# Patient Record
Sex: Female | Born: 1990 | Hispanic: Yes | Marital: Single | Smoking: Never smoker
Health system: Southern US, Community
[De-identification: ages and names within clinical notes are randomized; demographics above are authoritative.]

---

## 2019-03-05 ENCOUNTER — Emergency Department (HOSPITAL_COMMUNITY): Payer: 59

## 2019-03-05 ENCOUNTER — Emergency Department (HOSPITAL_COMMUNITY)
Admission: EM | Admit: 2019-03-05 | Discharge: 2019-03-05 | Disposition: A | Discharge status: Home / Self Care | Payer: 59 | Attending: Emergency Medicine | Admitting: Emergency Medicine

## 2019-03-05 ENCOUNTER — Encounter (HOSPITAL_COMMUNITY): Payer: Self-pay | Admitting: Emergency Medicine

## 2019-03-05 ENCOUNTER — Other Ambulatory Visit: Payer: Self-pay

## 2019-03-05 DIAGNOSIS — K8050 Calculus of bile duct without cholangitis or cholecystitis without obstruction: Secondary | ICD-10-CM | POA: Diagnosis not present

## 2019-03-05 DIAGNOSIS — R1013 Epigastric pain: Secondary | ICD-10-CM | POA: Diagnosis present

## 2019-03-05 LAB — CBC
HCT: 43.4 % (ref 36.0–46.0)
Hemoglobin: 13.9 g/dL (ref 12.0–15.0)
MCH: 28.1 pg (ref 26.0–34.0)
MCHC: 32 g/dL (ref 30.0–36.0)
MCV: 87.7 fL (ref 80.0–100.0)
Platelets: 302 10*3/uL (ref 150–400)
RBC: 4.95 MIL/uL (ref 3.87–5.11)
RDW: 13.8 % (ref 11.5–15.5)
WBC: 14.2 10*3/uL — ABNORMAL HIGH (ref 4.0–10.5)
nRBC: 0 % (ref 0.0–0.2)

## 2019-03-05 LAB — I-STAT BETA HCG BLOOD, ED (MC, WL, AP ONLY): I-stat hCG, quantitative: <5 m[IU]/mL (ref <5)

## 2019-03-05 LAB — URINALYSIS, ROUTINE W REFLEX MICROSCOPIC
Bilirubin Urine: NEGATIVE
Glucose, UA: NEGATIVE mg/dL
Hgb urine dipstick: NEGATIVE
Ketones, ur: NEGATIVE mg/dL
Leukocytes,Ua: NEGATIVE
Nitrite: NEGATIVE
Protein, ur: 100 mg/dL — AB
Specific Gravity, Urine: 1.026 (ref 1.005–1.030)
pH: 8 (ref 5.0–8.0)

## 2019-03-05 LAB — COMPREHENSIVE METABOLIC PANEL
ALT: 17 U/L (ref 0–44)
AST: 18 U/L (ref 15–41)
Albumin: 4.5 g/dL (ref 3.5–5.0)
Alkaline Phosphatase: 72 U/L (ref 38–126)
Anion gap: 11 (ref 5–15)
BUN: 10 mg/dL (ref 6–20)
CO2: 25 mmol/L (ref 22–32)
Calcium: 9.1 mg/dL (ref 8.9–10.3)
Chloride: 103 mmol/L (ref 98–111)
Creatinine, Ser: 0.82 mg/dL (ref 0.44–1.00)
GFR calc Af Amer: >60 mL/min (ref >60)
GFR calc non Af Amer: >60 mL/min (ref >60)
Glucose, Bld: 138 mg/dL — ABNORMAL HIGH (ref 70–99)
Potassium: 4.2 mmol/L (ref 3.5–5.1)
Sodium: 139 mmol/L (ref 135–145)
Total Bilirubin: 0.8 mg/dL (ref 0.3–1.2)
Total Protein: 8.7 g/dL — ABNORMAL HIGH (ref 6.5–8.1)

## 2019-03-05 LAB — LIPASE, BLOOD: Lipase: 29 U/L (ref 11–51)

## 2019-03-05 MED ORDER — ONDANSETRON 4 MG PO TBDP
4.0000 mg | ORAL_TABLET | Freq: Once | ORAL | Status: AC
  Administered 2019-03-05: 03:00:00 4 mg via ORAL
  Filled 2019-03-05: qty 1

## 2019-03-05 MED ORDER — PANTOPRAZOLE SODIUM 40 MG PO TBEC
40.0000 mg | DELAYED_RELEASE_TABLET | Freq: Every day | ORAL | Status: DC
  Administered 2019-03-05: 40 mg via ORAL
  Filled 2019-03-05: qty 1

## 2019-03-05 MED ORDER — ALUM & MAG HYDROXIDE-SIMETH 200-200-20 MG/5ML PO SUSP
30.0000 mL | Freq: Once | ORAL | Status: AC
  Administered 2019-03-05: 30 mL via ORAL
  Filled 2019-03-05: qty 30

## 2019-03-05 MED ORDER — SODIUM CHLORIDE 0.9% FLUSH: 3.0000 mL | Freq: Once | INTRAVENOUS | Status: DC

## 2019-03-05 MED ORDER — ONDANSETRON 4 MG PO TBDP: 4.0000 mg | ORAL_TABLET | Freq: Three times a day (TID) | ORAL | 0 refills | Status: DC | PRN

## 2019-03-05 MED ORDER — HYDROCODONE-ACETAMINOPHEN 5-325 MG PO TABS: 1.0000 | ORAL_TABLET | Freq: Four times a day (QID) | ORAL | 0 refills | Status: DC | PRN

## 2019-03-05 MED ORDER — ONDANSETRON HCL 4 MG/2ML IJ SOLN
4.0000 mg | Freq: Once | INTRAMUSCULAR | Status: AC
  Administered 2019-03-05: 4 mg via INTRAVENOUS
  Filled 2019-03-05: qty 2

## 2019-03-05 MED ORDER — MORPHINE SULFATE (PF) 4 MG/ML IV SOLN
4.0000 mg | Freq: Once | INTRAVENOUS | Status: AC
  Administered 2019-03-05: 4 mg via INTRAVENOUS
  Filled 2019-03-05: qty 1

## 2019-03-05 NOTE — ED Notes (Signed)
Provided patient with oral hydration. Instructed patient to notify RN if N/V occurs. Will continue to monitor patient.

## 2019-03-05 NOTE — ED Provider Notes (Signed)
[facility]-EMERGENCY DEPT Provider Note   CSN: [id number] Arrival date & time: 03/05/19  0240    History   Chief Complaint Chief Complaint  Patient presents with  . Abdominal Pain  . Back Pain    HPI Sandra Stewart is a 28 y.o. female.     HPI  This is a 28 year old female who presents with abdominal pain.  Patient reports onset of epigastric pain 4 hours prior to arrival.  This was after eating.  Patient reports upper epigastric pain radiates into her back.  It is sharp and crampy.  She reports 2 episodes of nonbilious, nonbloody.  Any chest pain, shortness of breath, fevers.  Patient denies any flank pain or urinary symptoms.  She has not taken anything for her symptoms.  Currently she rates her pain at 8 out of 10.  History reviewed. No pertinent past medical history.  There are no active problems to display for this patient.   History reviewed. No pertinent surgical history.   OB History   No obstetric history on file.      Home Medications    Prior to Admission medications   Medication Sig Start Date End Date Taking? Authorizing Provider  HYDROcodone-acetaminophen (NORCO/VICODIN) 5-325 MG tablet Take 1 tablet by mouth every 6 (six) hours as needed. 03/05/19   Toshika Parrow, Mayer Maskerourtney F, MD  ondansetron (ZOFRAN ODT) 4 MG disintegrating tablet Take 1 tablet (4 mg total) by mouth every 8 (eight) hours as needed for nausea or vomiting. 03/05/19   Gabryel Talamo, Mayer Maskerourtney F, MD    Family History No family history on file.  Social History Social History   Tobacco Use  . Smoking status: Never Smoker  . Smokeless tobacco: Never Used  Substance Use Topics  . Alcohol use: Never    Frequency: Never  . Drug use: Never     Allergies   Patient has no allergy information on record.   Review of Systems Review of Systems  Constitutional: Negative for fever.  Respiratory: Negative for shortness of breath.   Cardiovascular: Negative for chest pain.   Gastrointestinal: Positive for abdominal pain, nausea and vomiting. Negative for diarrhea.  Genitourinary: Negative for dysuria and flank pain.  Musculoskeletal: Positive for back pain.  All other systems reviewed and are negative.    Physical Exam Updated Vital Signs BP 126/73 (BP Location: Left Arm)   Pulse 70   Temp 97.8 F (36.6 C) (Oral)   Resp 18   Ht 1.549 m (5\' 1" )   Wt 86.6 kg   SpO2 97%   BMI 36.09 kg/m   Physical Exam Vitals signs and nursing note reviewed.  Constitutional:      Appearance: She is well-developed. She is obese.  HENT:     Head: Normocephalic and atraumatic.  Neck:     Musculoskeletal: Neck supple.  Cardiovascular:     Rate and Rhythm: Normal rate and regular rhythm.  Pulmonary:     Effort: Pulmonary effort is normal. No respiratory distress.  Abdominal:     General: Bowel sounds are normal.     Palpations: Abdomen is soft.     Tenderness: There is abdominal tenderness in the right upper quadrant and epigastric area.  Skin:    General: Skin is warm and dry.  Neurological:     Mental Status: She is alert and oriented to person, place, and time.      ED Treatments / Results  Labs (all labs ordered are listed, but only abnormal results are displayed)  Labs Reviewed  COMPREHENSIVE METABOLIC PANEL - Abnormal; Notable for the following components:      Result Value   Glucose, Bld 138 (*)    Total Protein 8.7 (*)    All other components within normal limits  CBC - Abnormal; Notable for the following components:   WBC 14.2 (*)    All other components within normal limits  URINALYSIS, ROUTINE W REFLEX MICROSCOPIC - Abnormal; Notable for the following components:   APPearance CLOUDY (*)    Protein, ur 100 (*)    Bacteria, UA RARE (*)    All other components within normal limits  LIPASE, BLOOD  I-STAT BETA HCG BLOOD, ED ([facility], [facility], [facility] ONLY)    EKG None  Radiology US Abdomen Limited Ruq  Result Date: 03/05/2019 CLINICAL DATA:   Epigastric abdominal pain since last night. EXAM: ULTRASOUND ABDOMEN LIMITED RIGHT UPPER QUADRANT COMPARISON:  None. FINDINGS: Gallbladder: Shadowing 1.2 cm calcified gallstone within the nondistended gallbladder. No gallbladder wall thickening, sonographic Murphy sign or pericholecystic fluid. Common bile duct: Diameter: 2 mm Liver: No focal lesion identified. Within normal limits in parenchymal echogenicity. Portal vein is patent on color Doppler imaging with normal direction of blood flow towards the liver. IMPRESSION: 1. Cholelithiasis.  No sonographic evidence of acute cholecystitis. 2. No biliary ductal dilatation. 3. Normal liver. Electronically Signed   By: Delbert Phenix M.D.   On: 03/05/2019 06:53    Procedures Procedures (including critical care time)  Medications Ordered in ED Medications  sodium chloride flush (NS) 0.9 % injection 3 mL (3 mLs Intravenous Not Given 03/05/19 0332)  pantoprazole (PROTONIX) EC tablet 40 mg (40 mg Oral Given 03/05/19 0450)  alum & mag hydroxide-simeth (MAALOX/MYLANTA) 200-200-20 MG/5ML suspension 30 mL (30 mLs Oral Given 03/05/19 0328)  ondansetron (ZOFRAN-ODT) disintegrating tablet 4 mg (4 mg Oral Given 03/05/19 0328)  morphine 4 MG/ML injection 4 mg (4 mg Intravenous Given 03/05/19 0535)  ondansetron (ZOFRAN) injection 4 mg (4 mg Intravenous Given 03/05/19 0535)     Initial Impression / Assessment and Plan / ED Course  I have reviewed the triage vital signs and the nursing notes.  Pertinent labs & imaging results that were available during my care of the patient were reviewed by me and considered in my medical decision making (see chart for details).  Clinical Course as of Mar 04 656  Sun Mar 05, 2019  0518 With worsening pain after drinking.  Will obtain ultrasound to evaluate gallbladder.  Patient was given morphine and Zofran.   [CH]    Clinical Course User Index [CH] Pallavi Clifton, Mayer Masker, MD       Patient presents with epigastric pain.  Onset of  pain while eating.  She is overall nontoxic-appearing.  She does have some epigastric tenderness on exam.  Considerations include reflux, pancreatitis, cholecystitis.  She is overall well-appearing.  Vital signs are reassuring.  Lab work obtained and patient was initially given a GI cocktail and Zofran.  She had minimal improvement.  She subsequently drank water and had worsening of pain.  For this reason, right upper quadrant ultrasound was obtained.  I have reviewed her lab work.  Slight leukocytosis but otherwise normal LFTs.  Normal lipase.  Ultrasound shows gallstones.  No evidence of cholecystitis.  On recheck, she is better after pain medication.  Discussed with her symptom control at home and follow-up with general surgery.  She was given strict return precautions.  After history, exam, and medical workup I feel the patient has been  appropriately medically screened and is safe for discharge home. Pertinent diagnoses were discussed with the patient. Patient was given return precautions.   Final Clinical Impressions(s) / ED Diagnoses   Final diagnoses:  Epigastric pain  Biliary colic    ED Discharge Orders         Ordered    HYDROcodone-acetaminophen (NORCO/VICODIN) 5-325 MG tablet  Every 6 hours PRN     03/05/19 0652    ondansetron (ZOFRAN ODT) 4 MG disintegrating tablet  Every 8 hours PRN     03/05/19 0370           Shon Baton, MD 03/05/19 [phone]

## 2019-03-05 NOTE — ED Notes (Signed)
Ultrasound at bedside

## 2019-03-05 NOTE — ED Triage Notes (Signed)
Patient here from home with complaints of upper back pain near neck and mid epigastric abd pain that started this morning. Reports n/v. Ambulatory. Denies pregnancy.

## 2019-03-05 NOTE — Discharge Instructions (Signed)
You were seen today for abdominal pain.  You have gallstones that are likely causing your pain.  Take medications as prescribed.  Follow-up with general surgery as you may need to have your gallbladder taken out.  If you develop worsening pain, fevers, nausea or vomiting, you should be reevaluated.

## 2019-11-19 ENCOUNTER — Other Ambulatory Visit: Payer: Self-pay

## 2019-11-19 ENCOUNTER — Emergency Department (HOSPITAL_COMMUNITY)
Admission: EM | Admit: 2019-11-19 | Discharge: 2019-11-19 | Disposition: A | Discharge status: Home / Self Care | Payer: 59 | Attending: Emergency Medicine | Admitting: Emergency Medicine

## 2019-11-19 ENCOUNTER — Emergency Department (HOSPITAL_COMMUNITY): Payer: 59

## 2019-11-19 DIAGNOSIS — R1013 Epigastric pain: Secondary | ICD-10-CM

## 2019-11-19 DIAGNOSIS — K8050 Calculus of bile duct without cholangitis or cholecystitis without obstruction: Secondary | ICD-10-CM | POA: Diagnosis not present

## 2019-11-19 LAB — URINALYSIS, ROUTINE W REFLEX MICROSCOPIC
Bacteria, UA: NONE SEEN
Bilirubin Urine: NEGATIVE
Glucose, UA: NEGATIVE mg/dL
Hgb urine dipstick: NEGATIVE
Ketones, ur: NEGATIVE mg/dL
Leukocytes,Ua: NEGATIVE
Nitrite: NEGATIVE
Protein, ur: 30 mg/dL — AB
Specific Gravity, Urine: 1.023 (ref 1.005–1.030)
pH: 8 (ref 5.0–8.0)

## 2019-11-19 LAB — CBC
HCT: 41.1 % (ref 36.0–46.0)
Hemoglobin: 13.5 g/dL (ref 12.0–15.0)
MCH: 28.1 pg (ref 26.0–34.0)
MCHC: 32.8 g/dL (ref 30.0–36.0)
MCV: 85.4 fL (ref 80.0–100.0)
Platelets: 272 10*3/uL (ref 150–400)
RBC: 4.81 MIL/uL (ref 3.87–5.11)
RDW: 14.2 % (ref 11.5–15.5)
WBC: 12.7 10*3/uL — ABNORMAL HIGH (ref 4.0–10.5)
nRBC: 0 % (ref 0.0–0.2)

## 2019-11-19 LAB — LIPASE, BLOOD: Lipase: 26 U/L (ref 11–51)

## 2019-11-19 LAB — COMPREHENSIVE METABOLIC PANEL
ALT: 23 U/L (ref 0–44)
AST: 19 U/L (ref 15–41)
Albumin: 4 g/dL (ref 3.5–5.0)
Alkaline Phosphatase: 56 U/L (ref 38–126)
Anion gap: 9 (ref 5–15)
BUN: 9 mg/dL (ref 6–20)
CO2: 24 mmol/L (ref 22–32)
Calcium: 9.1 mg/dL (ref 8.9–10.3)
Chloride: 106 mmol/L (ref 98–111)
Creatinine, Ser: 0.77 mg/dL (ref 0.44–1.00)
GFR calc Af Amer: >60 mL/min (ref >60)
GFR calc non Af Amer: >60 mL/min (ref >60)
Glucose, Bld: 135 mg/dL — ABNORMAL HIGH (ref 70–99)
Potassium: 4 mmol/L (ref 3.5–5.1)
Sodium: 139 mmol/L (ref 135–145)
Total Bilirubin: 0.8 mg/dL (ref 0.3–1.2)
Total Protein: 7 g/dL (ref 6.5–8.1)

## 2019-11-19 LAB — I-STAT BETA HCG BLOOD, ED (MC, WL, AP ONLY): I-stat hCG, quantitative: <5 m[IU]/mL (ref <5)

## 2019-11-19 MED ORDER — FENTANYL CITRATE (PF) 100 MCG/2ML IJ SOLN
50.0000 ug | Freq: Once | INTRAMUSCULAR | Status: DC
  Filled 2019-11-19: qty 2

## 2019-11-19 MED ORDER — HYDROCODONE-ACETAMINOPHEN 5-325 MG PO TABS: 1.0000 | ORAL_TABLET | ORAL | 0 refills | Status: DC | PRN

## 2019-11-19 MED ORDER — SODIUM CHLORIDE 0.9% FLUSH: 3.0000 mL | Freq: Once | INTRAVENOUS | Status: DC

## 2019-11-19 MED ORDER — MORPHINE SULFATE (PF) 4 MG/ML IV SOLN
4.0000 mg | Freq: Once | INTRAVENOUS | Status: AC
  Administered 2019-11-19: 07:00:00 4 mg via INTRAVENOUS
  Filled 2019-11-19: qty 1

## 2019-11-19 MED ORDER — ONDANSETRON 4 MG PO TBDP: 4.0000 mg | ORAL_TABLET | Freq: Three times a day (TID) | ORAL | 0 refills | Status: DC | PRN

## 2019-11-19 NOTE — ED Triage Notes (Signed)
Per pt has hx of gallstones and tonight pt started having severe upper gastric pain. Pt said no nausea but did throw up tonight. Pt has no fevers,

## 2019-11-19 NOTE — Discharge Instructions (Signed)
Return to the ED with any concerns including vomiting and not able to keep down liquids or your medications, abdominal pain especially if it localizes to the right lower abdomen, fever or chills, and decreased urine output, decreased level of alertness or lethargy, or any other alarming symptoms.  °

## 2019-11-19 NOTE — ED Notes (Signed)
Pt given water for fluid challenge 

## 2019-11-19 NOTE — ED Notes (Signed)
Patient transported to Ultrasound 

## 2019-11-19 NOTE — ED Provider Notes (Signed)
[facility] EMERGENCY DEPARTMENT Provider Note   CSN: [id number] Arrival date & time: 11/19/19  5400     History Chief Complaint  Patient presents with  . Abdominal Pain    Sandra Stewart is a 28 y.o. female with no major medical problems presents to the Emergency Department complaining of gradual, persistent, progressively worsening epigastric and right upper quadrant abdominal pain onset around 11 PM.  Patient reports pain is stabbing and sharp in nature.  She rates it as a 9/10.  Patient reports it does radiate into her back.  Patient reports she has previously had pain like this in the past.  She was evaluated in April 2020 for this and diagnosed with gallstones.  She has not followed up with general surgery.  Patient reports severe nausea but no vomiting.  No fever, chills, headache, neck pain, chest pain, weakness, dizziness, syncope.  Patient denies urinary and vaginal symptoms.  No treatments prior to arrival.  The history is provided by the patient and medical records. No language interpreter was used.       No past medical history on file.  There are no problems to display for this patient.   No past surgical history on file.   OB History   No obstetric history on file.     No family history on file.  Social History   Tobacco Use  . Smoking status: Never Smoker  . Smokeless tobacco: Never Used  Substance Use Topics  . Alcohol use: Never  . Drug use: Never    Home Medications Prior to Admission medications   Medication Sig Start Date End Date Taking? Authorizing Provider  HYDROcodone-acetaminophen (NORCO/VICODIN) 5-325 MG tablet Take 1 tablet by mouth every 6 (six) hours as needed. 03/05/19   Horton, Mayer Masker, MD  ondansetron (ZOFRAN ODT) 4 MG disintegrating tablet Take 1 tablet (4 mg total) by mouth every 8 (eight) hours as needed for nausea or vomiting. 03/05/19   Horton, Mayer Masker, MD    Allergies    Patient has no allergy  information on record.  Review of Systems   Review of Systems  Constitutional: Negative for appetite change, diaphoresis, fatigue, fever and unexpected weight change.  HENT: Negative for mouth sores and trouble swallowing.   Respiratory: Negative for cough, chest tightness, shortness of breath, wheezing and stridor.   Cardiovascular: Negative for chest pain and palpitations.  Gastrointestinal: Positive for abdominal pain and nausea. Negative for abdominal distention, blood in stool, constipation, diarrhea, rectal pain and vomiting.  Genitourinary: Negative for difficulty urinating, dysuria, flank pain, frequency, hematuria and urgency.  Musculoskeletal: Negative for back pain, neck pain and neck stiffness.  Skin: Negative for rash.  Neurological: Negative for weakness.  Hematological: Negative for adenopathy.  Psychiatric/Behavioral: Negative for confusion.  All other systems reviewed and are negative.   Physical Exam Updated Vital Signs BP 122/82 (BP Location: Right Arm)   Pulse 62   Temp 98.3 F (36.8 C) (Oral)   Resp 16   SpO2 100%   Physical Exam Vitals and nursing note reviewed.  Constitutional:      General: She is not in acute distress.    Appearance: She is not diaphoretic.  HENT:     Head: Normocephalic.  Eyes:     General: No scleral icterus.    Conjunctiva/sclera: Conjunctivae normal.  Cardiovascular:     Rate and Rhythm: Normal rate and regular rhythm.     Pulses: Normal pulses.  Radial pulses are 2+ on the right side and 2+ on the left side.  Pulmonary:     Effort: No tachypnea, accessory muscle usage, prolonged expiration, respiratory distress or retractions.     Breath sounds: No stridor.     Comments: Equal chest rise. No increased work of breathing. Abdominal:     General: There is no distension.     Palpations: Abdomen is soft.     Tenderness: There is abdominal tenderness in the right upper quadrant and epigastric area. There is no guarding  or rebound.  Musculoskeletal:     Cervical back: Normal range of motion.     Comments: Moves all extremities equally and without difficulty.  Skin:    General: Skin is warm and dry.     Capillary Refill: Capillary refill takes less than 2 seconds.  Neurological:     Mental Status: She is alert.     GCS: GCS eye subscore is 4. GCS verbal subscore is 5. GCS motor subscore is 6.     Comments: Speech is clear and goal oriented.  Psychiatric:        Mood and Affect: Mood normal.     ED Results / Procedures / Treatments   Labs (all labs ordered are listed, but only abnormal results are displayed) Labs Reviewed  COMPREHENSIVE METABOLIC PANEL - Abnormal; Notable for the following components:      Result Value   Glucose, Bld 135 (*)    All other components within normal limits  CBC - Abnormal; Notable for the following components:   WBC 12.7 (*)    All other components within normal limits  URINALYSIS, ROUTINE W REFLEX MICROSCOPIC - Abnormal; Notable for the following components:   APPearance CLOUDY (*)    Protein, ur 30 (*)    All other components within normal limits  LIPASE, BLOOD  I-STAT BETA HCG BLOOD, ED ([facility], [facility], [facility] ONLY)    Radiology US Abdomen Limited  Result Date: 11/19/2019 CLINICAL DATA:  History of gallstones. Right upper quadrant pain. Vomiting. EXAM: ULTRASOUND ABDOMEN LIMITED RIGHT UPPER QUADRANT COMPARISON:  03/05/2019 FINDINGS: Gallbladder: Evidence of gallbladder sludge and mild cholelithiasis with largest stone measuring 1.4 cm. Borderline wall thickening measuring 3.2 mm. No adjacent free fluid. Negative sonographic Murphy sign. Common bile duct: Diameter: 3.2 mm. Liver: Mild increased parenchymal echogenicity likely due to steatosis without focal mass. Portal vein is patent on color Doppler imaging with normal direction of blood flow towards the liver. Other: None. IMPRESSION: Mild cholelithiasis and gallbladder sludge without additional sonographic evidence to  suggest cholecystitis. Mild hepatic steatosis without focal mass. Electronically Signed   By: Elberta Fortisaniel  Boyle M.D.   On: 11/19/2019 05:26    Procedures Procedures (including critical care time)  Medications Ordered in ED Medications  sodium chloride flush (NS) 0.9 % injection 3 mL (has no administration in time range)  fentaNYL (SUBLIMAZE) injection 50 mcg (50 mcg Intravenous Not Given 11/19/19 0707)  morphine 4 MG/ML injection 4 mg (4 mg Intravenous Given 11/19/19 [id number])    ED Course  I have reviewed the triage vital signs and the nursing notes.  Pertinent labs & imaging results that were available during my care of the patient were reviewed by me and considered in my medical decision making (see chart for details).    MDM Rules/Calculators/A&P                       Patient presents with epigastric and right upper quadrant abdominal  pain.  She does have a history of gallstones.  Considerations tonight include reflux, pancreatitis, cholecystitis.  Labs are reassuring along with vital signs.  She is afebrile without tachycardia or hypotension.  No evidence of sepsis.  I personally reviewed the chart.  Ultrasound from April 2020 does show gallstones.  She did not have cholecystitis at that time.  Nausea medication and pain medication given here.  Will repeat ultrasound to ensure no common bile duct stone or cholecystitis.  7:15 AM [place] with gallstones but without cholecystitis or CBD stone. Pt with continued pain.  Will give additional meds and PO trial.  At shift change care was transferred to Dr. Marcha Dutton who will re-evaulate after PO trial and determine disposition. Pt with need outpatient surgery consult.     Final Clinical Impression(s) / ED Diagnoses Final diagnoses:  Epigastric pain  Biliary colic    Rx / DC Orders ED Discharge Orders    None       Janina Trafton, Gwenlyn Perking 11/19/19 2549    Varney Biles, MD 11/19/19 2341

## 2019-11-19 NOTE — ED Notes (Signed)
This RN drew up pt's fentanyl before pt was transported to [place]. Fentanyl was not given prior to transport to [place]. When pt returned from [place], provider saw pt at bedside and ordered morphine dose, full dose fentanyl wasted in biohazard container. Total of 2 mLs wasted. Witnessed by Maretta Bees, Therapist, sports.

## 2019-11-19 NOTE — ED Notes (Signed)
Pt placed on continuous pulse ox

## 2020-02-05 ENCOUNTER — Other Ambulatory Visit: Payer: Self-pay

## 2020-02-05 ENCOUNTER — Encounter (HOSPITAL_COMMUNITY): Payer: Self-pay

## 2020-02-05 ENCOUNTER — Observation Stay (HOSPITAL_COMMUNITY)
Admission: EM | Admit: 2020-02-05 | Discharge: 2020-02-07 | Disposition: A | Discharge status: Home / Self Care | Payer: 59 | Attending: Surgery | Admitting: Surgery

## 2020-02-05 ENCOUNTER — Emergency Department (HOSPITAL_COMMUNITY): Payer: 59

## 2020-02-05 DIAGNOSIS — Z6837 Body mass index (BMI) 37.0-37.9, adult: Secondary | ICD-10-CM | POA: Insufficient documentation

## 2020-02-05 DIAGNOSIS — K819 Cholecystitis, unspecified: Secondary | ICD-10-CM

## 2020-02-05 DIAGNOSIS — R739 Hyperglycemia, unspecified: Secondary | ICD-10-CM | POA: Diagnosis not present

## 2020-02-05 DIAGNOSIS — K8010 Calculus of gallbladder with chronic cholecystitis without obstruction: Principal | ICD-10-CM | POA: Insufficient documentation

## 2020-02-05 DIAGNOSIS — R109 Unspecified abdominal pain: Secondary | ICD-10-CM | POA: Diagnosis present

## 2020-02-05 DIAGNOSIS — K810 Acute cholecystitis: Secondary | ICD-10-CM | POA: Diagnosis present

## 2020-02-05 DIAGNOSIS — Z20822 Contact with and (suspected) exposure to covid-19: Secondary | ICD-10-CM | POA: Insufficient documentation

## 2020-02-05 DIAGNOSIS — R1011 Right upper quadrant pain: Secondary | ICD-10-CM

## 2020-02-05 LAB — CBC WITH DIFFERENTIAL/PLATELET
Abs Immature Granulocytes: 0.05 10*3/uL (ref 0.00–0.07)
Basophils Absolute: 0.1 10*3/uL (ref 0.0–0.1)
Basophils Relative: 1 %
Eosinophils Absolute: 0 10*3/uL (ref 0.0–0.5)
Eosinophils Relative: 0 %
HCT: 42.3 % (ref 36.0–46.0)
Hemoglobin: 13.5 g/dL (ref 12.0–15.0)
Immature Granulocytes: 0 %
Lymphocytes Relative: 15 %
Lymphs Abs: 2 10*3/uL (ref 0.7–4.0)
MCH: 27.8 pg (ref 26.0–34.0)
MCHC: 31.9 g/dL (ref 30.0–36.0)
MCV: 87.2 fL (ref 80.0–100.0)
Monocytes Absolute: 0.6 10*3/uL (ref 0.1–1.0)
Monocytes Relative: 5 %
Neutro Abs: 10.3 10*3/uL — ABNORMAL HIGH (ref 1.7–7.7)
Neutrophils Relative %: 79 %
Platelets: 298 10*3/uL (ref 150–400)
RBC: 4.85 MIL/uL (ref 3.87–5.11)
RDW: 13.4 % (ref 11.5–15.5)
WBC: 13 10*3/uL — ABNORMAL HIGH (ref 4.0–10.5)
nRBC: 0 % (ref 0.0–0.2)

## 2020-02-05 LAB — COMPREHENSIVE METABOLIC PANEL
ALT: 20 U/L (ref 0–44)
AST: 29 U/L (ref 15–41)
Albumin: 4.3 g/dL (ref 3.5–5.0)
Alkaline Phosphatase: 56 U/L (ref 38–126)
Anion gap: 10 (ref 5–15)
BUN: 14 mg/dL (ref 6–20)
CO2: 26 mmol/L (ref 22–32)
Calcium: 8.9 mg/dL (ref 8.9–10.3)
Chloride: 105 mmol/L (ref 98–111)
Creatinine, Ser: 0.92 mg/dL (ref 0.44–1.00)
GFR calc Af Amer: >60 mL/min (ref >60)
GFR calc non Af Amer: >60 mL/min (ref >60)
Glucose, Bld: 153 mg/dL — ABNORMAL HIGH (ref 70–99)
Potassium: 4.7 mmol/L (ref 3.5–5.1)
Sodium: 141 mmol/L (ref 135–145)
Total Bilirubin: 1.1 mg/dL (ref 0.3–1.2)
Total Protein: 7.5 g/dL (ref 6.5–8.1)

## 2020-02-05 LAB — RESPIRATORY PANEL BY RT PCR (FLU A&B, COVID)
Influenza A by PCR: NEGATIVE
Influenza B by PCR: NEGATIVE
SARS Coronavirus 2 by RT PCR: NEGATIVE

## 2020-02-05 LAB — URINALYSIS, ROUTINE W REFLEX MICROSCOPIC
Bilirubin Urine: NEGATIVE
Glucose, UA: NEGATIVE mg/dL
Hgb urine dipstick: NEGATIVE
Ketones, ur: NEGATIVE mg/dL
Leukocytes,Ua: NEGATIVE
Nitrite: NEGATIVE
Protein, ur: NEGATIVE mg/dL
Specific Gravity, Urine: 1.021 (ref 1.005–1.030)
pH: 7 (ref 5.0–8.0)

## 2020-02-05 LAB — HIV ANTIBODY (ROUTINE TESTING W REFLEX): HIV Screen 4th Generation wRfx: NONREACTIVE

## 2020-02-05 LAB — I-STAT BETA HCG BLOOD, ED (MC, WL, AP ONLY): I-stat hCG, quantitative: <5 m[IU]/mL (ref <5)

## 2020-02-05 LAB — LIPASE, BLOOD: Lipase: 22 U/L (ref 11–51)

## 2020-02-05 MED ORDER — ENOXAPARIN SODIUM 40 MG/0.4ML ~~LOC~~ SOLN
40.0000 mg | SUBCUTANEOUS | Status: DC
  Administered 2020-02-05: 40 mg via SUBCUTANEOUS
  Filled 2020-02-05: qty 0.4

## 2020-02-05 MED ORDER — SODIUM CHLORIDE 0.9 % IV BOLUS
1000.0000 mL | Freq: Once | INTRAVENOUS | Status: AC
  Administered 2020-02-05: 1000 mL via INTRAVENOUS

## 2020-02-05 MED ORDER — DIPHENHYDRAMINE HCL 25 MG PO CAPS: 25.0000 mg | ORAL_CAPSULE | Freq: Four times a day (QID) | ORAL | Status: DC | PRN

## 2020-02-05 MED ORDER — POLYETHYLENE GLYCOL 3350 17 G PO PACK: 17.0000 g | PACK | Freq: Every day | ORAL | Status: DC | PRN

## 2020-02-05 MED ORDER — IOHEXOL 300 MG/ML  SOLN
100.0000 mL | Freq: Once | INTRAMUSCULAR | Status: AC | PRN
  Administered 2020-02-05: 100 mL via INTRAVENOUS

## 2020-02-05 MED ORDER — MORPHINE SULFATE (PF) 4 MG/ML IV SOLN
4.0000 mg | Freq: Once | INTRAVENOUS | Status: AC
  Administered 2020-02-05: 4 mg via INTRAVENOUS
  Filled 2020-02-05: qty 1

## 2020-02-05 MED ORDER — HYDROMORPHONE HCL 1 MG/ML IJ SOLN: 0.5000 mg | INTRAMUSCULAR | Status: DC | PRN

## 2020-02-05 MED ORDER — DIPHENHYDRAMINE HCL 50 MG/ML IJ SOLN: 25.0000 mg | Freq: Four times a day (QID) | INTRAMUSCULAR | Status: DC | PRN

## 2020-02-05 MED ORDER — SODIUM CHLORIDE (PF) 0.9 % IJ SOLN
INTRAMUSCULAR | Status: AC
  Filled 2020-02-05: qty 50

## 2020-02-05 MED ORDER — ACETAMINOPHEN 325 MG PO TABS: 650.0000 mg | ORAL_TABLET | Freq: Four times a day (QID) | ORAL | Status: DC | PRN

## 2020-02-05 MED ORDER — SODIUM CHLORIDE 0.9 % IV SOLN
2.0000 g | INTRAVENOUS | Status: DC
  Administered 2020-02-05: 2 g via INTRAVENOUS
  Filled 2020-02-05: qty 2
  Filled 2020-02-05: qty 20

## 2020-02-05 MED ORDER — ONDANSETRON HCL 4 MG/2ML IJ SOLN
4.0000 mg | Freq: Once | INTRAMUSCULAR | Status: AC
  Administered 2020-02-05: 4 mg via INTRAVENOUS
  Filled 2020-02-05: qty 2

## 2020-02-05 MED ORDER — SODIUM CHLORIDE 0.9 % IV SOLN: INTRAVENOUS | Status: DC

## 2020-02-05 MED ORDER — ONDANSETRON HCL 4 MG/2ML IJ SOLN: 4.0000 mg | Freq: Four times a day (QID) | INTRAMUSCULAR | Status: DC | PRN

## 2020-02-05 MED ORDER — METOPROLOL TARTRATE 5 MG/5ML IV SOLN: 5.0000 mg | Freq: Four times a day (QID) | INTRAVENOUS | Status: DC | PRN

## 2020-02-05 MED ORDER — ACETAMINOPHEN 650 MG RE SUPP: 650.0000 mg | Freq: Four times a day (QID) | RECTAL | Status: DC | PRN

## 2020-02-05 MED ORDER — ONDANSETRON 4 MG PO TBDP: 4.0000 mg | ORAL_TABLET | Freq: Four times a day (QID) | ORAL | Status: DC | PRN

## 2020-02-05 NOTE — ED Provider Notes (Signed)
[facility]-EMERGENCY DEPT Provider Note   CSN: [id number] Arrival date & time: 02/05/20  7672    History Chief Complaint  Patient presents with  . Abdominal Pain    Sandra Stewart is a 29 y.o. female with past medical history significant for obesity, gallstones who presents for evaluation of abdominal pain.  Patient states approximately 5 AM she developed severe right upper quadrant abdominal pain.  There is no radiation.  States pain similar to her prior "gallstone attacks."  Persistent nausea without emesis.  She took a home Norco x2 which did not help with her pain so she proceeded here to the emergency department for evaluation.  Patient denies fever, chills, chest pain, shortness of breath, dysuria, diarrhea, constipation.  Rates pain a 10/10.  Denies additional aggravating relieving factors.  No chest pain, shortness of breath, hemoptysis. Has appointment in April with Gen Surgery for evaluation for gallstones.  Was evaluated in April and Dec 2020 for similar pain.  History obtained from patient and past medical history. No interpretor was used.  HPI     History reviewed. No pertinent past medical history.  Patient Active Problem List   Diagnosis Date Noted  . Acute cholecystitis 02/05/2020    History reviewed. No pertinent surgical history.   OB History   No obstetric history on file.     History reviewed. No pertinent family history.  Social History   Tobacco Use  . Smoking status: Never Smoker  . Smokeless tobacco: Never Used  Substance Use Topics  . Alcohol use: Never  . Drug use: Never    Home Medications Prior to Admission medications   Medication Sig Start Date End Date Taking? Authorizing Provider  ibuprofen (ADVIL) 200 MG tablet Take 400 mg by mouth daily as needed for mild pain.   Yes [provider]    Allergies    Patient has no known allergies.  Review of Systems   Review of Systems  HENT: Negative.     Respiratory: Negative.   Cardiovascular: Negative.   Gastrointestinal: Positive for abdominal pain and nausea. Negative for abdominal distention, anal bleeding, blood in stool, constipation, diarrhea, rectal pain and vomiting.  Genitourinary: Negative.   Musculoskeletal: Negative.   Skin: Negative.   Neurological: Negative.   All other systems reviewed and are negative.   Physical Exam Updated Vital Signs BP 115/81   Pulse 94   Temp 98 F (36.7 C) (Oral)   Resp 18   Ht 5\' 1"  (1.549 m)   Wt 90.7 kg   LMP 12/08/2019   SpO2 99%   BMI 37.79 kg/m   Physical Exam Vitals and nursing note reviewed.  Constitutional:      General: She is not in acute distress.    Appearance: She is well-developed. She is not ill-appearing, toxic-appearing or diaphoretic.  HENT:     Head: Normocephalic and atraumatic.     Mouth/Throat:     Mouth: Mucous membranes are moist.  Eyes:     Pupils: Pupils are equal, round, and reactive to light.  Cardiovascular:     Rate and Rhythm: Normal rate.     Heart sounds: Normal heart sounds.  Pulmonary:     Effort: Pulmonary effort is normal. No respiratory distress.     Breath sounds: Normal breath sounds.  Abdominal:     General: Bowel sounds are normal. There is no distension.     Palpations: Abdomen is soft.     Tenderness: There is abdominal tenderness in the  right upper quadrant. There is no right CVA tenderness, left CVA tenderness, guarding or rebound. Negative signs include Murphy's sign.     Hernia: No hernia is present.     Comments: Moderate tenderness to RUQ. Negative Murphy sign. No rebound or guarding.  Musculoskeletal:        General: Normal range of motion.     Cervical back: Normal range of motion.     Comments: Moves all 4 extremities without difficulty.  Skin:    General: Skin is warm and dry.     Capillary Refill: Capillary refill takes less than 2 seconds.     Comments: Brisk cap refill.  Neurological:     Mental Status: She is  alert.     ED Results / Procedures / Treatments   Labs (all labs ordered are listed, but only abnormal results are displayed) Labs Reviewed  CBC WITH DIFFERENTIAL/PLATELET - Abnormal; Notable for the following components:      Result Value   WBC 13.0 (*)    Neutro Abs 10.3 (*)    All other components within normal limits  COMPREHENSIVE METABOLIC PANEL - Abnormal; Notable for the following components:   Glucose, Bld 153 (*)    All other components within normal limits  URINALYSIS, ROUTINE W REFLEX MICROSCOPIC - Abnormal; Notable for the following components:   APPearance HAZY (*)    All other components within normal limits  RESPIRATORY PANEL BY RT PCR (FLU A&B, COVID)  LIPASE, BLOOD  HIV ANTIBODY (ROUTINE TESTING W REFLEX)  CBC  CREATININE, SERUM  I-STAT BETA HCG BLOOD, ED ([facility], [facility], [facility] ONLY)    EKG None  Radiology CT ABDOMEN PELVIS W CONTRAST  Result Date: 02/05/2020 CLINICAL DATA:  Right upper quadrant abdominal pain intermittently for 1 year, worsening today. Cholelithiasis on ultrasound earlier today. EXAM: CT ABDOMEN AND PELVIS WITH CONTRAST TECHNIQUE: Multidetector CT imaging of the abdomen and pelvis was performed using the standard protocol following bolus administration of intravenous contrast. CONTRAST:  OMNIPAQUE IOHEXOL 300 MG/ML  SOLN COMPARISON:  02/05/2020 abdominal sonogram. FINDINGS: Lower chest: No significant pulmonary nodules or acute consolidative airspace disease. Hepatobiliary: Normal liver size. No liver mass. Faintly calcified gallstones in the gallbladder. Mild diffuse gallbladder wall thickening. Borderline mild gallbladder distention. No pericholecystic fluid. No biliary ductal dilatation. CBD diameter 3 mm. Pancreas: Normal, with no mass or duct dilation. Spleen: Normal size. No mass. Adrenals/Urinary Tract: Normal adrenals. Normal kidneys with no hydronephrosis and no renal mass. Normal bladder. Stomach/Bowel: Normal non-distended stomach. Normal  caliber small bowel with no small bowel wall thickening. Apparent appendectomy. Normal large bowel with no diverticulosis, large bowel wall thickening or pericolonic fat stranding. Vascular/Lymphatic: Normal caliber abdominal aorta. Patent portal, splenic, hepatic and renal veins. No pathologically enlarged lymph nodes in the abdomen or pelvis. Reproductive: Grossly normal uterus.  No adnexal mass. Other: No pneumoperitoneum, ascites or focal fluid collection. Musculoskeletal: No aggressive appearing focal osseous lesions. IMPRESSION: 1. Cholelithiasis. Mild diffuse gallbladder wall thickening. Borderline mild gallbladder distention. No pericholecystic fluid. Acute calculus cholecystitis not excluded. Hepatobiliary scintigraphy study may be considered for further evaluation as clinically warranted. 2. No biliary ductal dilation. Electronically Signed   By: Delbert Phenix M.D.   On: 02/05/2020 12:36   US Abdomen Limited RUQ  Result Date: 02/05/2020 CLINICAL DATA:  Upper abdominal pain EXAM: ULTRASOUND ABDOMEN LIMITED RIGHT UPPER QUADRANT COMPARISON:  None. FINDINGS: Gallbladder: Within the gallbladder, there is an echogenic focus which moves and shadows consistent with cholelithiasis. This echogenic focus measures  1.0 cm in length. Gallbladder wall thickness is upper normal. No pericholecystic fluid. No sonographic Murphy sign noted by sonographer. Common bile duct: Diameter: 4 mm. No intrahepatic or extrahepatic biliary duct dilatation. Liver: No focal lesion identified. Within normal limits in parenchymal echogenicity. Portal vein is patent on color Doppler imaging with normal direction of blood flow towards the liver. Other: None. IMPRESSION: Cholelithiasis with gallbladder wall thickening upper normal. This finding may warrant correlation with nuclear medicine hepatobiliary gene study to assess for cystic duct patency. Study otherwise unremarkable. Electronically Signed   By: Lowella Grip III M.D.   On:  02/05/2020 10:48    Procedures Procedures (including critical care time)  Medications Ordered in ED Medications  enoxaparin (LOVENOX) injection 40 mg (has no administration in time range)  0.9 %  sodium chloride infusion (has no administration in time range)  cefTRIAXone (ROCEPHIN) 2 g in sodium chloride 0.9 % 100 mL IVPB (has no administration in time range)  acetaminophen (TYLENOL) tablet 650 mg (has no administration in time range)    Or  acetaminophen (TYLENOL) suppository 650 mg (has no administration in time range)  HYDROmorphone (DILAUDID) injection 0.5 mg (has no administration in time range)  diphenhydrAMINE (BENADRYL) capsule 25 mg (has no administration in time range)    Or  diphenhydrAMINE (BENADRYL) injection 25 mg (has no administration in time range)  polyethylene glycol (MIRALAX / GLYCOLAX) packet 17 g (has no administration in time range)  ondansetron (ZOFRAN-ODT) disintegrating tablet 4 mg (has no administration in time range)    Or  ondansetron (ZOFRAN) injection 4 mg (has no administration in time range)  metoprolol tartrate (LOPRESSOR) injection 5 mg (has no administration in time range)  sodium chloride 0.9 % bolus 1,000 mL (0 mLs Intravenous Stopped 02/05/20 1121)  ondansetron (ZOFRAN) injection 4 mg (4 mg Intravenous Given 02/05/20 0949)  morphine 4 MG/ML injection 4 mg (4 mg Intravenous Given 02/05/20 0949)  morphine 4 MG/ML injection 4 mg (4 mg Intravenous Given 02/05/20 1121)  sodium chloride (PF) 0.9 % injection (  Given by Other 02/05/20 1223)  iohexol (OMNIPAQUE) 300 MG/ML solution 100 mL (100 mLs Intravenous Contrast Given 02/05/20 1156)   ED Course  I have reviewed the triage vital signs and the nursing notes.  Pertinent labs & imaging results that were available during my care of the patient were reviewed by me and considered in my medical decision making (see chart for details).  29 year old female appears otherwise well presents for evaluation abdominal  pain.  Known history of gallstones states this feels similar.  Pain began at 5 AM this morning.  Patient moderate tenderness to right upper quadrant however negative Murphy sign.  Took 1 dose home Norco without relief of her pain. Has FU with gen surgery for gallstones in April. Personally reviewed [place] in Dec with cholecystitis at that time. Will repeat US to ensure no cholecystitis, CB duct stone.  Labs and imaging personally reviewed and interpreted: CBC leukocytosis with left shift at 13.0 UA negative CMP mild hyperglycemia to 153 no acute electrolyte, renal or liver normality Lipase 22 Preg negative [place] RUQ with borderline gallbladder wall thickening. CT with similar read borderline. Can order HIDA to r/o acute cholecystitis.  Will consult with surgery given her pain was uncontrolled with p.o. Norco at home.  Patient reassessed. Pain controlled with IV meds in ED. CONSULT with OR nurse for general surgery. Discuss [place] and CT findings. They will come down to assess patient. Will hold on HIDA scan  until general surgery has evaluated patient.  CONSULT with General surgery, [place], PA-C who has evaluated patient.  Plan for lap chole for acute cholecystitis tomorrow morning.  They will place antibiotic orders and admit patient.  The patient appears reasonably stabilized for admission considering the current resources, flow, and capabilities available in the ED at this time, and I doubt any other [facility] requiring further screening and/or treatment in the ED prior to admission.    MDM Rules/Calculators/A&P                       Final Clinical Impression(s) / ED Diagnoses Final diagnoses:  RUQ pain  Cholecystitis    Rx / DC Orders ED Discharge Orders    None       Shriya Aker A, PA-C 02/05/20 1500    Margarita Grizzle, MD 02/06/20 [phone]

## 2020-02-05 NOTE — ED Notes (Signed)
Pt transported to CT ?

## 2020-02-05 NOTE — H&P (Signed)
[facility] Admission Note  Sandra Stewart Jul 30, 1991  [id number].    Requesting MD: Fernand Parkins Chief Complaint/Reason for Consult: gallstones  HPI:  Patient is an otherwise healthy 29 year old female who presented to [facility] with abdominal pain that started around 5 AM. Pain in located in epigastrium and RUQ and progressively worsened. Patient has known gallstones and has had 2 prior episodes of similar pain. She reports some nausea and vomiting today, non-bloody. She reports subjective fever and chills today. Denies chest pain, SOB, diarrhea, constipation, urinary symptoms, or sick contacts. Takes no daily medications and NKDA. Past abdominal surgery includes laparoscopic appendectomy. Patient reports she drinks about 1 glass wine per week, denies tobacco or illicit drug use. 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   ROS: Review of Systems  Constitutional: Positive for chills and fever.  Respiratory: Negative for shortness of breath.   Cardiovascular: Negative for chest pain.  Gastrointestinal: Positive for abdominal pain, nausea and vomiting. Negative for constipation and diarrhea.  Genitourinary: Negative for dysuria, frequency and urgency.  All other systems reviewed and are negative.   History reviewed. No pertinent family history.  History reviewed. No pertinent past medical history.  History reviewed. No pertinent surgical history.  Social History:  reports that she has never smoked. She has never used smokeless tobacco. She reports that she does not drink alcohol or use drugs.  Allergies: Not on File  (Not in a hospital admission)   Blood pressure 120/82, pulse 75, temperature 98 F (36.7 C), temperature source Oral, resp. rate 17, height 5\' 1"  (1.549 m), weight 90.7 kg, last menstrual period 12/08/2019, SpO2 98 %. Physical Exam:  General: pleasant, WD, obese hispanic female who is laying in bed in NAD HEENT: Sclera are anicteric.  PERRL.  Ears and  nose without any masses or lesions.  Mouth is pink and moist Heart: regular, rate, and rhythm.  Normal s1,s2. No obvious murmurs, gallops, or rubs noted.  Palpable radial and pedal pulses bilaterally Lungs: CTAB, no wheezes, rhonchi, or rales noted.  Respiratory effort nonlabored Abd: soft, mildly ttp in RUQ, no peritonitis, negative Murphy sign, ND, +BS, no masses, hernias, or organomegaly MS: all 4 extremities are symmetrical with no cyanosis, clubbing, or edema. Skin: warm and dry with no masses, lesions, or rashes Neuro: Cranial nerves 2-12 grossly intact, sensation grossly intact Psych: A&Ox3 with an appropriate affect.   Results for orders placed or performed during the hospital encounter of 02/05/20 (from the past 48 hour(s))  CBC with Differential     Status: Abnormal   Collection Time: 02/05/20  9:44 AM  Result Value Ref Range   WBC 13.0 (H) 4.0 - 10.5 K/uL   RBC 4.85 3.87 - 5.11 MIL/uL   Hemoglobin 13.5 12.0 - 15.0 g/dL   HCT 02/07/20 60.7 - 37.1 %   MCV 87.2 80.0 - 100.0 fL   MCH 27.8 26.0 - 34.0 pg   MCHC 31.9 30.0 - 36.0 g/dL   RDW 06.2 69.4 - 85.4 %   Platelets 298 150 - 400 K/uL   nRBC 0.0 0.0 - 0.2 %   Neutrophils Relative % 79 %   Neutro Abs 10.3 (H) 1.7 - 7.7 K/uL   Lymphocytes Relative 15 %   Lymphs Abs 2.0 0.7 - 4.0 K/uL   Monocytes Relative 5 %   Monocytes Absolute 0.6 0.1 - 1.0 K/uL   Eosinophils Relative 0 %   Eosinophils Absolute 0.0 0.0 - 0.5 K/uL   Basophils Relative 1 %  Basophils Absolute 0.1 0.0 - 0.1 K/uL   Immature Granulocytes 0 %   Abs Immature Granulocytes 0.05 0.00 - 0.07 K/uL    Comment: Performed at [facility], [address] [place], [place] [address]  Comprehensive metabolic panel     Status: Abnormal   Collection Time: 02/05/20  9:44 AM  Result Value Ref Range   Sodium 141 135 - 145 mmol/L   Potassium 4.7 3.5 - 5.1 mmol/L   Chloride 105 98 - 111 mmol/L   CO2 26 22 - 32 mmol/L   Glucose, Bld 153 (H) 70 - 99 mg/dL     Comment: Glucose reference range applies only to samples taken after fasting for at least 8 hours.   BUN 14 6 - 20 mg/dL   Creatinine, Ser 3.14 0.44 - 1.00 mg/dL   Calcium 8.9 8.9 - 97.0 mg/dL   Total Protein 7.5 6.5 - 8.1 g/dL   Albumin 4.3 3.5 - 5.0 g/dL   AST 29 15 - 41 U/L   ALT 20 0 - 44 U/L   Alkaline Phosphatase 56 38 - 126 U/L   Total Bilirubin 1.1 0.3 - 1.2 mg/dL   GFR calc non Af Amer >60 >60 mL/min   GFR calc Af Amer >60 >60 mL/min   Anion gap 10 5 - 15    Comment: Performed at [facility], [address] [place], [place] [address]  Lipase, blood     Status: None   Collection Time: 02/05/20  9:44 AM  Result Value Ref Range   Lipase 22 11 - 51 U/L    Comment: Performed at [facility], [address], [place], [place] [address]  I-Stat Beta hCG blood, ED ([facility], [facility], [facility] only)     Status: None   Collection Time: 02/05/20  9:49 AM  Result Value Ref Range   I-stat hCG, quantitative <5.0 <5 mIU/mL   Comment 3            Comment:   GEST. AGE      CONC.  (mIU/mL)   <=1 WEEK        5 - 50     2 WEEKS       50 - 500     3 WEEKS       100 - 10,000     4 WEEKS     1,000 - 30,000        FEMALE AND NON-PREGNANT FEMALE:     LESS THAN 5 mIU/mL   Urinalysis, Routine w reflex microscopic     Status: Abnormal   Collection Time: 02/05/20 11:16 AM  Result Value Ref Range   Color, Urine YELLOW YELLOW   APPearance HAZY (A) CLEAR   Specific Gravity, Urine 1.021 1.005 - 1.030   pH 7.0 5.0 - 8.0   Glucose, UA NEGATIVE NEGATIVE mg/dL   Hgb urine dipstick NEGATIVE NEGATIVE   Bilirubin Urine NEGATIVE NEGATIVE   Ketones, ur NEGATIVE NEGATIVE mg/dL   Protein, ur NEGATIVE NEGATIVE mg/dL   Nitrite NEGATIVE NEGATIVE   Leukocytes,Ua NEGATIVE NEGATIVE    Comment: Performed at [facility], [address] [place], [place] [address]   CT ABDOMEN PELVIS W CONTRAST  Result Date: 02/05/2020 CLINICAL DATA:  Right upper quadrant abdominal pain  intermittently for 1 year, worsening today. Cholelithiasis on ultrasound earlier today. EXAM: CT ABDOMEN AND PELVIS WITH CONTRAST TECHNIQUE: Multidetector CT imaging of the abdomen and pelvis was performed using the standard protocol  following bolus administration of intravenous contrast. CONTRAST:  15mL OMNIPAQUE IOHEXOL 300 MG/ML  SOLN COMPARISON:  02/05/2020 abdominal sonogram. FINDINGS: Lower chest: No significant pulmonary nodules or acute consolidative airspace disease. Hepatobiliary: Normal liver size. No liver mass. Faintly calcified gallstones in the gallbladder. Mild diffuse gallbladder wall thickening. Borderline mild gallbladder distention. No pericholecystic fluid. No biliary ductal dilatation. CBD diameter 3 mm. Pancreas: Normal, with no mass or duct dilation. Spleen: Normal size. No mass. Adrenals/Urinary Tract: Normal adrenals. Normal kidneys with no hydronephrosis and no renal mass. Normal bladder. Stomach/Bowel: Normal non-distended stomach. Normal caliber small bowel with no small bowel wall thickening. Apparent appendectomy. Normal large bowel with no diverticulosis, large bowel wall thickening or pericolonic fat stranding. Vascular/Lymphatic: Normal caliber abdominal aorta. Patent portal, splenic, hepatic and renal veins. No pathologically enlarged lymph nodes in the abdomen or pelvis. Reproductive: Grossly normal uterus.  No adnexal mass. Other: No pneumoperitoneum, ascites or focal fluid collection. Musculoskeletal: No aggressive appearing focal osseous lesions. IMPRESSION: 1. Cholelithiasis. Mild diffuse gallbladder wall thickening. Borderline mild gallbladder distention. No pericholecystic fluid. Acute calculus cholecystitis not excluded. Hepatobiliary scintigraphy study may be considered for further evaluation as clinically warranted. 2. No biliary ductal dilation. Electronically Signed   By: Ilona Sorrel M.D.   On: 02/05/2020 12:36   US Abdomen Limited RUQ  Result Date:  02/05/2020 CLINICAL DATA:  Upper abdominal pain EXAM: ULTRASOUND ABDOMEN LIMITED RIGHT UPPER QUADRANT COMPARISON:  None. FINDINGS: Gallbladder: Within the gallbladder, there is an echogenic focus which moves and shadows consistent with cholelithiasis. This echogenic focus measures 1.0 cm in length. Gallbladder wall thickness is upper normal. No pericholecystic fluid. No sonographic Murphy sign noted by sonographer. Common bile duct: Diameter: 4 mm. No intrahepatic or extrahepatic biliary duct dilatation. Liver: No focal lesion identified. Within normal limits in parenchymal echogenicity. Portal vein is patent on color Doppler imaging with normal direction of blood flow towards the liver. Other: None. IMPRESSION: Cholelithiasis with gallbladder wall thickening upper normal. This finding may warrant correlation with nuclear medicine hepatobiliary gene study to assess for cystic duct patency. Study otherwise unremarkable. Electronically Signed   By: Lowella Grip III M.D.   On: 02/05/2020 10:48      Assessment/Plan Acute cholecystitis - RUQ [place] with cholelithiasis and mild gallbladder wall thickening - WBC 13, afebrile, not tachycardic or hypotensive - LFTs WNL and Tbili normal   Admit to observation and plan for laparoscopic cholecystectomy tomorrow AM. Likely discharge afterwards.   Brigid Re, [facility] 02/05/2020, 2:33 PM Please see Amion for pager number during day hours 7:00am-4:30pm

## 2020-02-05 NOTE — Discharge Instructions (Signed)
[facility] LAPAROSCOPIC SURGERY: POST OP INSTRUCTIONS Always review your discharge instruction sheet given to you by the facility where your surgery was performed. IF YOU HAVE DISABILITY OR FAMILY LEAVE FORMS, YOU MUST BRING THEM TO THE OFFICE FOR PROCESSING.   DO NOT GIVE THEM TO YOUR DOCTOR.  PAIN CONTROL  1. First take acetaminophen (Tylenol) AND/or ibuprofen (Advil) to control your pain after surgery.  Follow directions on package.  Taking acetaminophen (Tylenol) and/or ibuprofen (Advil) regularly after surgery will help to control your pain and lower the amount of prescription pain medication you may need.  You should not take more than 3,000 mg (3 grams) of acetaminophen (Tylenol) in 24 hours.  You should not take ibuprofen (Advil), aleve, motrin, naprosyn or other NSAIDS if you have a history of stomach ulcers or chronic kidney disease.  2. A prescription for pain medication may be given to you upon discharge.  Take your pain medication as prescribed, if you still have uncontrolled pain after taking acetaminophen (Tylenol) or ibuprofen (Advil). 3. Use ice packs to help control pain. 4. If you need a refill on your pain medication, please contact your pharmacy.  They will contact our office to request authorization. Prescriptions will not be filled after 5pm or on week-ends.  HOME MEDICATIONS 5. Take your usually prescribed medications unless otherwise directed.  DIET 6. You should follow a light diet the first few days after arrival home.  Be sure to include lots of fluids daily. Avoid fatty, fried foods.   CONSTIPATION 7. It is common to experience some constipation after surgery and if you are taking pain medication.  Increasing fluid intake and taking a stool softener (such as Colace) will usually help or prevent this problem from occurring.  A mild laxative (Milk of Magnesia or Miralax) should be taken according to package instructions if there are no bowel  movements after 48 hours.  WOUND/INCISION CARE 8. Most patients will experience some swelling and bruising in the area of the incisions.  Ice packs will help.  Swelling and bruising can take several days to resolve.  9. Unless discharge instructions indicate otherwise, follow guidelines below  a. STERI-STRIPS - you may remove your outer bandages 48 hours after surgery, and you may shower at that time.  You have steri-strips (small skin tapes) in place directly over the incision.  These strips should be left on the skin for 7-10 days.   b. DERMABOND/SKIN GLUE - you may shower in 24 hours.  The glue will flake off over the next 2-3 weeks. 10. Any sutures or staples will be removed at the office during your follow-up visit.  ACTIVITIES 11. You may resume regular (light) daily activities beginning the next day--such as daily self-care, walking, climbing stairs--gradually increasing activities as tolerated.  You may have sexual intercourse when it is comfortable.  Refrain from any heavy lifting or straining until approved by your doctor. a. You may drive when you are no longer taking prescription pain medication, you can comfortably wear a seatbelt, and you can safely maneuver your car and apply brakes.  FOLLOW-UP 12. You should see your doctor in the office for a follow-up appointment approximately 2-3 weeks after your surgery.  You should have been given your post-op/follow-up appointment when your surgery was scheduled.  If you did not receive a post-op/follow-up appointment, make sure that you call for this appointment within a day or two after you arrive home to insure a convenient appointment time.     WHEN TO CALL YOUR DOCTOR: 1. Fever over 101.0 2. Inability to urinate 3. Continued bleeding from incision. 4. Increased pain, redness, or drainage from the incision. 5. Increasing abdominal pain  The clinic staff is available to answer your questions during regular business hours.  Please don't  hesitate to call and ask to speak to one of the nurses for clinical concerns.  If you have a medical emergency, go to the nearest emergency room or call 911.  A surgeon from [facility] is always on call at the hospital. [address], [address], [place], [place]  [address] ? [address] [address], [place], [place]   [address] [phone] ? [phone] ? FAX [phone] Web site: [email or web address]  .........   Managing Your Pain After Surgery Without Opioids    Thank you for participating in our program to help patients manage their pain after surgery without opioids. This is part of our effort to provide you with the best care possible, without exposing you or your family to the risk that opioids pose.  What pain can I expect after surgery? You can expect to have some pain after surgery. This is normal. The pain is typically worse the day after surgery, and quickly begins to get better. Many studies have found that many patients are able to manage their pain after surgery with Over-the-Counter (OTC) medications such as Tylenol and Motrin. If you have a condition that does not allow you to take Tylenol or Motrin, notify your surgical team.  How will I manage my pain? The best strategy for controlling your pain after surgery is around the clock pain control with Tylenol (acetaminophen) and Motrin (ibuprofen or Advil). Alternating these medications with each other allows you to maximize your pain control. In addition to Tylenol and Motrin, you can use heating pads or ice packs on your incisions to help reduce your pain.  How will I alternate your regular strength over-the-counter pain medication? You will take a dose of pain medication every three hours. ; Start by taking 650 mg of Tylenol (2 pills of 325 mg) ; 3 hours later take 600 mg of Motrin (3 pills of 200 mg) ; 3 hours after taking the Motrin take 650 mg of Tylenol ; 3 hours after that take 600 mg of  Motrin.   - 1 -  See example - if your first dose of Tylenol is at 12:00 PM   12:00 PM Tylenol 650 mg (2 pills of 325 mg)  3:00 PM Motrin 600 mg (3 pills of 200 mg)  6:00 PM Tylenol 650 mg (2 pills of 325 mg)  9:00 PM Motrin 600 mg (3 pills of 200 mg)  Continue alternating every 3 hours   We recommend that you follow this schedule around-the-clock for at least 3 days after surgery, or until you feel that it is no longer needed. Use the table on the last page of this handout to keep track of the medications you are taking. Important: Do not take more than 3000mg of Tylenol or 3200mg of Motrin in a 24-hour period. Do not take ibuprofen/Motrin if you have a history of bleeding stomach ulcers, severe kidney disease, &/or actively taking a blood thinner  What if I still have pain? If you have pain that is not controlled with the over-the-counter pain medications (Tylenol and Motrin or Advil) you might have what we call "breakthrough" pain. You will receive a prescription for a small amount of an opioid pain medication such as   Oxycodone, Tramadol, or Tylenol with Codeine. Use these opioid pills in the first 24 hours after surgery if you have breakthrough pain. Do not take more than 1 pill every 4-6 hours.  If you still have uncontrolled pain after using all opioid pills, don't hesitate to call our staff using the number provided. We will help make sure you are managing your pain in the best way possible, and if necessary, we can provide a prescription for additional pain medication.   Day 1    Time  Name of Medication Number of pills taken  Amount of Acetaminophen  Pain Level   Comments  AM PM       AM PM       AM PM       AM PM       AM PM       AM PM       AM PM       AM PM       Total Daily amount of Acetaminophen Do not take more than  3,000 mg per day      Day 2    Time  Name of Medication Number of pills taken  Amount of Acetaminophen  Pain Level   Comments  AM  PM       AM PM       AM PM       AM PM       AM PM       AM PM       AM PM       AM PM       Total Daily amount of Acetaminophen Do not take more than  3,000 mg per day      Day 3    Time  Name of Medication Number of pills taken  Amount of Acetaminophen  Pain Level   Comments  AM PM       AM PM       AM PM       AM PM          AM PM       AM PM       AM PM       AM PM       Total Daily amount of Acetaminophen Do not take more than  3,000 mg per day      Day 4    Time  Name of Medication Number of pills taken  Amount of Acetaminophen  Pain Level   Comments  AM PM       AM PM       AM PM       AM PM       AM PM       AM PM       AM PM       AM PM       Total Daily amount of Acetaminophen Do not take more than  3,000 mg per day      Day 5    Time  Name of Medication Number of pills taken  Amount of Acetaminophen  Pain Level   Comments  AM PM       AM PM       AM PM       AM PM       AM PM       AM PM       AM PM         AM PM       Total Daily amount of Acetaminophen Do not take more than  3,000 mg per day       Day 6    Time  Name of Medication Number of pills taken  Amount of Acetaminophen  Pain Level  Comments  AM PM       AM PM       AM PM       AM PM       AM PM       AM PM       AM PM       AM PM       Total Daily amount of Acetaminophen Do not take more than  3,000 mg per day      Day 7    Time  Name of Medication Number of pills taken  Amount of Acetaminophen  Pain Level   Comments  AM PM       AM PM       AM PM       AM PM       AM PM       AM PM       AM PM       AM PM       Total Daily amount of Acetaminophen Do not take more than  3,000 mg per day        For additional information about how and where to safely dispose of unused opioid medications - https://www.morepowerfulnc.org  Disclaimer: This document contains information and/or instructional materials adapted from [facility]  for the typical patient with your condition. It does not replace medical advice from your health care provider because your experience may differ from that of the typical patient. Talk to your health care provider if you have any questions about this document, your condition or your treatment plan. Adapted from [facility]  

## 2020-02-06 ENCOUNTER — Encounter (HOSPITAL_COMMUNITY): Payer: Self-pay | Admitting: Surgery

## 2020-02-06 ENCOUNTER — Observation Stay (HOSPITAL_COMMUNITY): Payer: 59 | Admitting: Certified Registered Nurse Anesthetist

## 2020-02-06 ENCOUNTER — Encounter (HOSPITAL_COMMUNITY)
Admission: EM | Disposition: A | Discharge status: Home / Self Care | Payer: Self-pay | Source: Home / Self Care | Attending: Emergency Medicine

## 2020-02-06 HISTORY — PX: CHOLECYSTECTOMY: SHX55

## 2020-02-06 LAB — CBC
HCT: 37 % (ref 36.0–46.0)
Hemoglobin: 11.6 g/dL — ABNORMAL LOW (ref 12.0–15.0)
MCH: 27.6 pg (ref 26.0–34.0)
MCHC: 31.4 g/dL (ref 30.0–36.0)
MCV: 88.1 fL (ref 80.0–100.0)
Platelets: 253 10*3/uL (ref 150–400)
RBC: 4.2 MIL/uL (ref 3.87–5.11)
RDW: 13.7 % (ref 11.5–15.5)
WBC: 9 10*3/uL (ref 4.0–10.5)
nRBC: 0 % (ref 0.0–0.2)

## 2020-02-06 LAB — COMPREHENSIVE METABOLIC PANEL
ALT: 17 U/L (ref 0–44)
AST: 15 U/L (ref 15–41)
Albumin: 3.2 g/dL — ABNORMAL LOW (ref 3.5–5.0)
Alkaline Phosphatase: 38 U/L (ref 38–126)
Anion gap: 6 (ref 5–15)
BUN: 7 mg/dL (ref 6–20)
CO2: 25 mmol/L (ref 22–32)
Calcium: 8.1 mg/dL — ABNORMAL LOW (ref 8.9–10.3)
Chloride: 107 mmol/L (ref 98–111)
Creatinine, Ser: 0.7 mg/dL (ref 0.44–1.00)
GFR calc Af Amer: >60 mL/min (ref >60)
GFR calc non Af Amer: >60 mL/min (ref >60)
Glucose, Bld: 96 mg/dL (ref 70–99)
Potassium: 3.6 mmol/L (ref 3.5–5.1)
Sodium: 138 mmol/L (ref 135–145)
Total Bilirubin: 0.8 mg/dL (ref 0.3–1.2)
Total Protein: 5.9 g/dL — ABNORMAL LOW (ref 6.5–8.1)

## 2020-02-06 LAB — SURGICAL PCR SCREEN
MRSA, PCR: NEGATIVE
Staphylococcus aureus: NEGATIVE

## 2020-02-06 SURGERY — LAPAROSCOPIC CHOLECYSTECTOMY WITH INTRAOPERATIVE CHOLANGIOGRAM
Anesthesia: General | Site: Abdomen

## 2020-02-06 MED ORDER — PHENYLEPHRINE 40 MCG/ML (10ML) SYRINGE FOR IV PUSH (FOR BLOOD PRESSURE SUPPORT)
PREFILLED_SYRINGE | INTRAVENOUS | Status: AC
  Filled 2020-02-06: qty 10

## 2020-02-06 MED ORDER — DEXAMETHASONE SODIUM PHOSPHATE 10 MG/ML IJ SOLN
INTRAMUSCULAR | Status: AC
  Filled 2020-02-06: qty 1

## 2020-02-06 MED ORDER — HYDROMORPHONE HCL 1 MG/ML IJ SOLN
0.2500 mg | INTRAMUSCULAR | Status: DC | PRN
  Administered 2020-02-06: 0.5 mg via INTRAVENOUS

## 2020-02-06 MED ORDER — ROCURONIUM BROMIDE 10 MG/ML (PF) SYRINGE
PREFILLED_SYRINGE | INTRAVENOUS | Status: AC
  Filled 2020-02-06: qty 10

## 2020-02-06 MED ORDER — 0.9 % SODIUM CHLORIDE (POUR BTL) OPTIME
TOPICAL | Status: DC | PRN
  Administered 2020-02-06: 1000 mL

## 2020-02-06 MED ORDER — BUPIVACAINE HCL 0.25 % IJ SOLN
INTRAMUSCULAR | Status: AC
  Filled 2020-02-06: qty 1

## 2020-02-06 MED ORDER — PROPOFOL 10 MG/ML IV BOLUS
INTRAVENOUS | Status: AC
  Filled 2020-02-06: qty 20

## 2020-02-06 MED ORDER — MIDAZOLAM HCL 5 MG/5ML IJ SOLN
INTRAMUSCULAR | Status: DC | PRN
  Administered 2020-02-06 (×2): 1 mg via INTRAVENOUS

## 2020-02-06 MED ORDER — FENTANYL CITRATE (PF) 250 MCG/5ML IJ SOLN
INTRAMUSCULAR | Status: AC
  Filled 2020-02-06: qty 5

## 2020-02-06 MED ORDER — ENOXAPARIN SODIUM 40 MG/0.4ML ~~LOC~~ SOLN: 40.0000 mg | SUBCUTANEOUS | Status: DC

## 2020-02-06 MED ORDER — ENSURE PRE-SURGERY PO LIQD
592.0000 mL | Freq: Once | ORAL | Status: AC
  Administered 2020-02-06: 592 mL via ORAL
  Filled 2020-02-06: qty 592

## 2020-02-06 MED ORDER — HYDROMORPHONE HCL 1 MG/ML IJ SOLN: 0.5000 mg | INTRAMUSCULAR | Status: DC | PRN

## 2020-02-06 MED ORDER — LIDOCAINE 2% (20 MG/ML) 5 ML SYRINGE
INTRAMUSCULAR | Status: DC | PRN
  Administered 2020-02-06: 60 mg via INTRAVENOUS

## 2020-02-06 MED ORDER — CELECOXIB 200 MG PO CAPS
400.0000 mg | ORAL_CAPSULE | ORAL | Status: AC
  Administered 2020-02-06: 400 mg via ORAL
  Filled 2020-02-06: qty 2

## 2020-02-06 MED ORDER — LIDOCAINE 2% (20 MG/ML) 5 ML SYRINGE
INTRAMUSCULAR | Status: AC
  Filled 2020-02-06: qty 5

## 2020-02-06 MED ORDER — LACTATED RINGERS IV SOLN
INTRAVENOUS | Status: AC | PRN
  Administered 2020-02-06: 1000 mL

## 2020-02-06 MED ORDER — ACETAMINOPHEN 500 MG PO TABS
1000.0000 mg | ORAL_TABLET | ORAL | Status: AC
  Administered 2020-02-06: 1000 mg via ORAL
  Filled 2020-02-06: qty 2

## 2020-02-06 MED ORDER — GABAPENTIN 300 MG PO CAPS
300.0000 mg | ORAL_CAPSULE | ORAL | Status: AC
  Administered 2020-02-06: 300 mg via ORAL
  Filled 2020-02-06: qty 1

## 2020-02-06 MED ORDER — ENSURE PRE-SURGERY PO LIQD
296.0000 mL | Freq: Once | ORAL | Status: DC
  Filled 2020-02-06: qty 296

## 2020-02-06 MED ORDER — PHENYLEPHRINE 40 MCG/ML (10ML) SYRINGE FOR IV PUSH (FOR BLOOD PRESSURE SUPPORT)
PREFILLED_SYRINGE | INTRAVENOUS | Status: DC | PRN
  Administered 2020-02-06: 80 ug via INTRAVENOUS

## 2020-02-06 MED ORDER — SCOPOLAMINE 1 MG/3DAYS TD PT72
1.0000 | MEDICATED_PATCH | TRANSDERMAL | Status: DC
  Administered 2020-02-06: 12:00:00 1.5 mg via TRANSDERMAL
  Filled 2020-02-06: qty 1

## 2020-02-06 MED ORDER — ACETAMINOPHEN 500 MG PO TABS
1000.0000 mg | ORAL_TABLET | Freq: Three times a day (TID) | ORAL | Status: DC
  Administered 2020-02-06 – 2020-02-07 (×2): 1000 mg via ORAL
  Filled 2020-02-06 (×2): qty 2

## 2020-02-06 MED ORDER — ONDANSETRON HCL 4 MG/2ML IJ SOLN
INTRAMUSCULAR | Status: AC
  Filled 2020-02-06: qty 2

## 2020-02-06 MED ORDER — MIDAZOLAM HCL 2 MG/2ML IJ SOLN
INTRAMUSCULAR | Status: AC
  Filled 2020-02-06: qty 2

## 2020-02-06 MED ORDER — LACTATED RINGERS IV SOLN: INTRAVENOUS | Status: DC

## 2020-02-06 MED ORDER — IBUPROFEN 400 MG PO TABS
600.0000 mg | ORAL_TABLET | Freq: Three times a day (TID) | ORAL | Status: DC
  Administered 2020-02-06 – 2020-02-07 (×3): 600 mg via ORAL
  Filled 2020-02-06 (×3): qty 1

## 2020-02-06 MED ORDER — ONDANSETRON HCL 4 MG/2ML IJ SOLN
INTRAMUSCULAR | Status: DC | PRN
  Administered 2020-02-06: 4 mg via INTRAVENOUS

## 2020-02-06 MED ORDER — FENTANYL CITRATE (PF) 250 MCG/5ML IJ SOLN
INTRAMUSCULAR | Status: DC | PRN
  Administered 2020-02-06 (×3): 50 ug via INTRAVENOUS

## 2020-02-06 MED ORDER — ROCURONIUM BROMIDE 50 MG/5ML IV SOSY
PREFILLED_SYRINGE | INTRAVENOUS | Status: DC | PRN
  Administered 2020-02-06: 50 mg via INTRAVENOUS
  Administered 2020-02-06: 10 mg via INTRAVENOUS

## 2020-02-06 MED ORDER — HYDROMORPHONE HCL 1 MG/ML IJ SOLN
INTRAMUSCULAR | Status: AC
  Filled 2020-02-06: qty 1

## 2020-02-06 MED ORDER — OXYCODONE HCL 5 MG PO TABS
5.0000 mg | ORAL_TABLET | ORAL | Status: DC | PRN
  Administered 2020-02-06: 5 mg via ORAL
  Filled 2020-02-06: qty 1

## 2020-02-06 MED ORDER — PROPOFOL 10 MG/ML IV BOLUS
INTRAVENOUS | Status: DC | PRN
  Administered 2020-02-06: 130 mg via INTRAVENOUS

## 2020-02-06 MED ORDER — DEXAMETHASONE SODIUM PHOSPHATE 10 MG/ML IJ SOLN
INTRAMUSCULAR | Status: DC | PRN
  Administered 2020-02-06: 5 mg via INTRAVENOUS

## 2020-02-06 MED ORDER — EPHEDRINE 5 MG/ML INJ
INTRAVENOUS | Status: AC
  Filled 2020-02-06: qty 10

## 2020-02-06 MED ORDER — SUGAMMADEX SODIUM 200 MG/2ML IV SOLN
INTRAVENOUS | Status: DC | PRN
  Administered 2020-02-06: 200 mg via INTRAVENOUS

## 2020-02-06 MED ORDER — BUPIVACAINE HCL (PF) 0.25 % IJ SOLN
INTRAMUSCULAR | Status: DC | PRN
  Administered 2020-02-06: 20 mL

## 2020-02-06 SURGICAL SUPPLY — 35 items
APPLIER CLIP ROT 10 11.4 M/L (STAPLE) ×3
CHLORAPREP W/TINT 26 (MISCELLANEOUS) ×3 IMPLANT
CLIP APPLIE ROT 10 11.4 M/L (STAPLE) ×1 IMPLANT
COVER MAYO STAND STRL (DRAPES) ×3 IMPLANT
COVER WAND RF STERILE (DRAPES) IMPLANT
DECANTER SPIKE VIAL GLASS SM (MISCELLANEOUS) ×3 IMPLANT
DERMABOND ADVANCED (GAUZE/BANDAGES/DRESSINGS) ×2
DERMABOND ADVANCED .7 DNX12 (GAUZE/BANDAGES/DRESSINGS) IMPLANT
DRAPE C-ARM 42X120 X-RAY (DRAPES) ×3 IMPLANT
DRAPE UTILITY XL STRL (DRAPES) ×3 IMPLANT
DRAPE WARM FLUID 44X44 (DRAPES) ×3 IMPLANT
ELECT REM PT RETURN 15FT ADLT (MISCELLANEOUS) ×3 IMPLANT
GLOVE INDICATOR 8.0 STRL GRN (GLOVE) ×3 IMPLANT
GLOVE SS BIOGEL STRL SZ 7.5 (GLOVE) ×1 IMPLANT
GLOVE SUPERSENSE BIOGEL SZ 7.5 (GLOVE) ×2
GOWN STRL REUS W/TWL XL LVL3 (GOWN DISPOSABLE) ×6 IMPLANT
HEMOSTAT SURGICEL 4X8 (HEMOSTASIS) IMPLANT
KIT BASIN OR (CUSTOM PROCEDURE TRAY) ×3 IMPLANT
KIT TURNOVER KIT A (KITS) IMPLANT
PENCIL SMOKE EVACUATOR (MISCELLANEOUS) IMPLANT
POUCH SPECIMEN RETRIEVAL 10MM (ENDOMECHANICALS) ×3 IMPLANT
PROTECTOR NERVE ULNAR (MISCELLANEOUS) IMPLANT
SCISSORS LAP 5X35 DISP (ENDOMECHANICALS) IMPLANT
SET CHOLANGIOGRAPH MIX (MISCELLANEOUS) ×3 IMPLANT
SET IRRIG TUBING LAPAROSCOPIC (IRRIGATION / IRRIGATOR) ×3 IMPLANT
SET TUBE SMOKE EVAC HIGH FLOW (TUBING) IMPLANT
SLEEVE XCEL OPT CAN 5 100 (ENDOMECHANICALS) ×3 IMPLANT
SUT MNCRL AB 4-0 PS2 18 (SUTURE) ×3 IMPLANT
TAPE CLOTH 4X10 WHT NS (GAUZE/BANDAGES/DRESSINGS) IMPLANT
TOWEL OR 17X26 10 PK STRL BLUE (TOWEL DISPOSABLE) ×3 IMPLANT
TOWEL OR NON WOVEN STRL DISP B (DISPOSABLE) ×3 IMPLANT
TRAY LAPAROSCOPIC (CUSTOM PROCEDURE TRAY) ×3 IMPLANT
TROCAR BLADELESS OPT 5 100 (ENDOMECHANICALS) ×3 IMPLANT
TROCAR XCEL BLUNT TIP 100MML (ENDOMECHANICALS) ×3 IMPLANT
TROCAR XCEL NON-BLD 11X100MML (ENDOMECHANICALS) ×3 IMPLANT

## 2020-02-06 NOTE — Interval H&P Note (Signed)
History and Physical Interval Note:  02/06/2020 12:12 PM  Sandra Stewart  has presented today for surgery, with the diagnosis of Cholecystitis.  The various methods of treatment have been discussed with the patient and family. After consideration of risks, benefits and other options for treatment, the patient has consented to  Procedure(s): LAPAROSCOPIC CHOLECYSTECTOMY WITH POSSIBLE INTRAOPERATIVE CHOLANGIOGRAM (N/A) as a surgical intervention.  The patient's history has been reviewed, patient examined, no change in status, stable for surgery.  I have reviewed the patient's chart and labs.  Questions were answered to the patient's satisfaction.     Lucan Riner A Willaim Mode

## 2020-02-06 NOTE — Op Note (Signed)
Laparoscopic Cholecystectomy  Procedure Note  Indications: This patient presents with symptomatic gallbladder disease and will undergo laparoscopic cholecystectomy.The procedure has been discussed with the patient. Operative and non operative treatments have been discussed. Risks of surgery include bleeding, infection,  Common bile duct injury,  Injury to the stomach,liver, colon,small intestine, abdominal wall,  Diaphragm,  Major blood vessels,  And the need for an open procedure.  Other risks include worsening of medical problems, death,  DVT and pulmonary embolism, and cardiovascular events.   Medical options have also been discussed. The patient has been informed of long term expectations of surgery and non surgical options,  The patient agrees to proceed.    Pre-operative Diagnosis: Calculus of gallbladder with acute cholecystitis, without mention of obstruction  Post-operative Diagnosis: Same  Surgeon: Dortha Schwalbe MD  Assistants: Leticia Clas   Anesthesia: General endotracheal anesthesia and Local anesthesia 0.25.% bupivacaine, with epinephrine  ASA Class: 2  Procedure Details  The patient was seen again in the Holding Room. The risks, benefits, complications, treatment options, and expected outcomes were discussed with the patient. The possibilities of reaction to medication, pulmonary aspiration, perforation of viscus, bleeding, recurrent infection, finding a normal gallbladder, the need for additional procedures, failure to diagnose a condition, the possible need to convert to an open procedure, and creating a complication requiring transfusion or operation were discussed with the patient. The patient and/or family concurred with the proposed plan, giving informed consent. The site of surgery properly noted/marked. The patient was taken to Operating Room, identified as Sandra Stewart and the procedure verified as Laparoscopic Cholecystectomy with Intraoperative Cholangiograms. A Time  Out was held and the above information confirmed.  Prior to the induction of general anesthesia, antibiotic prophylaxis was administered. General endotracheal anesthesia was then administered and tolerated well. After the induction, the abdomen was prepped in the usual sterile fashion. The patient was positioned in the supine position with the left arm comfortably tucked, along with some reverse Trendelenburg.  Local anesthetic agent was injected into the skin near the umbilicus and an incision made. The midline fascia was incised and the Hasson technique was used to introduce a 12 mm port under direct vision. It was secured with a figure of eight Vicryl suture placed in the usual fashion. Pneumoperitoneum was then created with CO2 and tolerated well without any adverse changes in the patient's vital signs. Additional trocars were introduced under direct vision with an 11 mm trocar in the epigastrium and 2 5 mm trocars in the right upper quadrant. All skin incisions were infiltrated with a local anesthetic agent before making the incision and placing the trocars.   The gallbladder was identified, the fundus grasped and retracted cephalad. Adhesions were lysed bluntly and with the electrocautery where indicated, taking care not to injure any adjacent organs or viscus. The infundibulum was grasped and retracted laterally, exposing the peritoneum overlying the triangle of Calot. This was then divided and exposed in a blunt fashion. The cystic duct was clearly identified and bluntly dissected circumferentially. The junctions of the gallbladder, cystic duct and common bile duct were clearly identified prior to the division of any linear structure.   Critical view obtained . Cystic duct was extremely small and would not accommodate a catheter for cholangiogram.  The cystic duct was then  ligated with surgical clips  on the patient side and  clipped on the gallbladder side and divided. The cystic artery was  identified, dissected free, ligated with clips and divided as well. Posterior  cystic artery clipped and divided.  The gallbladder was dissected from the liver bed in retrograde fashion with the electrocautery. The gallbladder was removed with Endocatch bag. . The liver bed was irrigated and inspected. Hemostasis was achieved with the electrocautery. Copious irrigation was utilized and was repeatedly aspirated until clear all particulate matter. Hemostasis was achieved with no signs  of bleeding or bile leakage.  Pneumoperitoneum was completely reduced after viewing removal of the trocars under direct vision. The wound was thoroughly irrigated and the fascia was then closed with a figure of eight suture; the skin was then closed with 4 0 monocryl   and a sterile dressing was applied.  Instrument, sponge, and needle counts were correct at closure and at the conclusion of the case.   Findings: Cholecystitis with Cholelithiasis  Estimated Blood Loss: Minimal         Drains: none          Total IV Fluids: per record          Specimens: Gallbladder           Complications: None; patient tolerated the procedure well.         Disposition: PACU - hemodynamically stable.         Condition: stable

## 2020-02-06 NOTE — Transfer of Care (Signed)
Immediate Anesthesia Transfer of Care Note  Patient: Sandra Stewart  Procedure(s) Performed: LAPAROSCOPIC CHOLECYSTECTOMY (N/A Abdomen)  Patient Location: PACU  Anesthesia Type:General  Level of Consciousness: awake, alert  and patient cooperative  Airway & Oxygen Therapy: Patient Spontanous Breathing  Post-op Assessment: Report given to RN and Post -op Vital signs reviewed and stable  Post vital signs: Reviewed and stable  Last Vitals:  Vitals Value Taken Time  BP 119/81 1414  Temp    Pulse 81 1414  Resp 12 1414  SpO2 100% 1414    Last Pain:  Vitals:   02/06/20 1152  TempSrc:   PainSc: 0-No pain         Complications: No apparent anesthesia complications

## 2020-02-06 NOTE — Anesthesia Postprocedure Evaluation (Signed)
Anesthesia Post Note  Patient: Sandra Stewart  Procedure(s) Performed: LAPAROSCOPIC CHOLECYSTECTOMY (N/A Abdomen)     Patient location during evaluation: PACU Anesthesia Type: General Level of consciousness: awake and alert Pain management: pain level controlled Vital Signs Assessment: post-procedure vital signs reviewed and stable Respiratory status: spontaneous breathing, nonlabored ventilation and respiratory function stable Cardiovascular status: blood pressure returned to baseline and stable Postop Assessment: no apparent nausea or vomiting Anesthetic complications: no    Last Vitals:  Vitals:   02/06/20 1440 02/06/20 1445  BP:  128/81  Pulse:  69  Resp:  13  Temp:  36.9 C  SpO2: 100% 96%    Last Pain:  Vitals:   02/06/20 1445  TempSrc:   PainSc: Asleep                 [name],W. EDMOND

## 2020-02-06 NOTE — Anesthesia Procedure Notes (Signed)
Procedure Name: Intubation Date/Time: 02/06/2020 1:03 PM Performed by: West Pugh, CRNA Pre-anesthesia Checklist: Patient identified, Emergency Drugs available, Suction available, Patient being monitored and Timeout performed Patient Re-evaluated:Patient Re-evaluated prior to induction Oxygen Delivery Method: Circle system utilized Preoxygenation: Pre-oxygenation with 100% oxygen Induction Type: IV induction Ventilation: Mask ventilation without difficulty Laryngoscope Size: Mac and 3 Grade View: Grade I Tube type: Oral Tube size: 7.0 mm Number of attempts: 1 Airway Equipment and Method: Stylet Placement Confirmation: ETT inserted through vocal cords under direct vision,  positive ETCO2,  CO2 detector and breath sounds checked- equal and bilateral Secured at: 20 cm Tube secured with: Tape Dental Injury: Teeth and Oropharynx as per pre-operative assessment

## 2020-02-06 NOTE — Anesthesia Preprocedure Evaluation (Addendum)
Anesthesia Evaluation  Patient identified by MRN, date of birth, ID band Patient awake    Reviewed: Allergy & Precautions, H&P , NPO status , Patient's Chart, lab work & pertinent test results  Airway Mallampati: II  TM Distance: >3 FB Neck ROM: Full    Dental no notable dental hx. (+) Teeth Intact, Dental Advisory Given   Pulmonary neg pulmonary ROS,    Pulmonary exam normal breath sounds clear to auscultation       Cardiovascular negative cardio ROS   Rhythm:Regular Rate:Normal     Neuro/Psych negative neurological ROS  negative psych ROS   GI/Hepatic negative GI ROS, Neg liver ROS,   Endo/Other  Morbid obesity  Renal/GU negative Renal ROS  negative genitourinary   Musculoskeletal   Abdominal   Peds  Hematology negative hematology ROS (+)   Anesthesia Other Findings   Reproductive/Obstetrics negative OB ROS                            Anesthesia Physical Anesthesia Plan  ASA: II  Anesthesia Plan: General   Post-op Pain Management:    Induction: Intravenous  PONV Risk Score and Plan: 4 or greater and Ondansetron, Dexamethasone and Midazolam  Airway Management Planned: Oral ETT  Additional Equipment:   Intra-op Plan:   Post-operative Plan: Extubation in OR  Informed Consent: I have reviewed the patients History and Physical, chart, labs and discussed the procedure including the risks, benefits and alternatives for the proposed anesthesia with the patient or authorized representative who has indicated his/her understanding and acceptance.   Dental advisory given  Plan Discussed with: CRNA  Anesthesia Plan Comments:         Anesthesia Quick Evaluation  

## 2020-02-07 LAB — SURGICAL PATHOLOGY

## 2020-02-07 MED ORDER — OXYCODONE HCL 5 MG PO TABS: 5.0000 mg | ORAL_TABLET | Freq: Four times a day (QID) | ORAL | 0 refills | Status: AC | PRN

## 2020-02-07 MED ORDER — ACETAMINOPHEN 500 MG PO TABS: 1000.0000 mg | ORAL_TABLET | Freq: Three times a day (TID) | ORAL | Status: AC | PRN

## 2020-02-07 NOTE — Progress Notes (Signed)
Pt alert and oriented, tolerating diet.  D/C instructions given, all questions answered.  Pt d/cd home. 

## 2020-02-07 NOTE — Discharge Summary (Signed)
[facility] Discharge Summary   Patient ID: Sandra Stewart MRN: [id number] DOB/AGE: 1991/03/17 29 y.o.  Admit date: 02/05/2020 Discharge date: 02/07/2020  Admitting Diagnosis: Acute cholecystitis  Discharge Diagnosis Patient Active Problem List   Diagnosis Date Noted  . Acute cholecystitis 02/05/2020    Consultants None  Imaging: CT ABDOMEN PELVIS W CONTRAST  Result Date: 02/05/2020 CLINICAL DATA:  Right upper quadrant abdominal pain intermittently for 1 year, worsening today. Cholelithiasis on ultrasound earlier today. EXAM: CT ABDOMEN AND PELVIS WITH CONTRAST TECHNIQUE: Multidetector CT imaging of the abdomen and pelvis was performed using the standard protocol following bolus administration of intravenous contrast. CONTRAST:  153mL OMNIPAQUE IOHEXOL 300 MG/ML  SOLN COMPARISON:  02/05/2020 abdominal sonogram. FINDINGS: Lower chest: No significant pulmonary nodules or acute consolidative airspace disease. Hepatobiliary: Normal liver size. No liver mass. Faintly calcified gallstones in the gallbladder. Mild diffuse gallbladder wall thickening. Borderline mild gallbladder distention. No pericholecystic fluid. No biliary ductal dilatation. CBD diameter 3 mm. Pancreas: Normal, with no mass or duct dilation. Spleen: Normal size. No mass. Adrenals/Urinary Tract: Normal adrenals. Normal kidneys with no hydronephrosis and no renal mass. Normal bladder. Stomach/Bowel: Normal non-distended stomach. Normal caliber small bowel with no small bowel wall thickening. Apparent appendectomy. Normal large bowel with no diverticulosis, large bowel wall thickening or pericolonic fat stranding. Vascular/Lymphatic: Normal caliber abdominal aorta. Patent portal, splenic, hepatic and renal veins. No pathologically enlarged lymph nodes in the abdomen or pelvis. Reproductive: Grossly normal uterus.  No adnexal mass. Other: No pneumoperitoneum, ascites or focal fluid collection. Musculoskeletal: No  aggressive appearing focal osseous lesions. IMPRESSION: 1. Cholelithiasis. Mild diffuse gallbladder wall thickening. Borderline mild gallbladder distention. No pericholecystic fluid. Acute calculus cholecystitis not excluded. Hepatobiliary scintigraphy study may be considered for further evaluation as clinically warranted. 2. No biliary ductal dilation. Electronically Signed   By: Ilona Sorrel M.D.   On: 02/05/2020 12:36   US Abdomen Limited RUQ  Result Date: 02/05/2020 CLINICAL DATA:  Upper abdominal pain EXAM: ULTRASOUND ABDOMEN LIMITED RIGHT UPPER QUADRANT COMPARISON:  None. FINDINGS: Gallbladder: Within the gallbladder, there is an echogenic focus which moves and shadows consistent with cholelithiasis. This echogenic focus measures 1.0 cm in length. Gallbladder wall thickness is upper normal. No pericholecystic fluid. No sonographic Murphy sign noted by sonographer. Common bile duct: Diameter: 4 mm. No intrahepatic or extrahepatic biliary duct dilatation. Liver: No focal lesion identified. Within normal limits in parenchymal echogenicity. Portal vein is patent on color Doppler imaging with normal direction of blood flow towards the liver. Other: None. IMPRESSION: Cholelithiasis with gallbladder wall thickening upper normal. This finding may warrant correlation with nuclear medicine hepatobiliary gene study to assess for cystic duct patency. Study otherwise unremarkable. Electronically Signed   By: Lowella Grip III M.D.   On: 02/05/2020 10:48    Procedures Dr. Brantley Stage (02/06/20) - Laparoscopic Cholecystectomy  Hospital Course:  Patient is a 29 year old female with known gallstones who presented to [facility] with abdominal pain.  Workup showed acute cholecystitis.  Patient was admitted and underwent procedure listed above.  Tolerated procedure well and was transferred to the floor.  Diet was advanced as tolerated.  On POD#1, the patient was voiding well, tolerating diet, ambulating well, pain well  controlled, vital signs stable, incisions c/d/i and felt stable for discharge home.  Patient will follow up in our office in 3-4 weeks and knows to call with questions or concerns.  She will call to confirm appointment date/time.    Physical Exam: General:  Alert, NAD,  pleasant, comfortable Abd:  Soft, ND, mild tenderness, incisions C/D/I  I have personally looked this patient up in the [place] Controlled Substance Database and reviewed their medications.    Allergies as of 02/07/2020   No Known Allergies     Medication List    TAKE these medications   acetaminophen 500 MG tablet Commonly known as: TYLENOL Take 2 tablets (1,000 mg total) by mouth every 8 (eight) hours as needed for mild pain, fever or headache.   ibuprofen 200 MG tablet Commonly known as: ADVIL Take 400 mg by mouth daily as needed for mild pain.   oxyCODONE 5 MG immediate release tablet Commonly known as: Oxy IR/ROXICODONE Take 1-2 tablets (5-10 mg total) by mouth every 6 (six) hours as needed for moderate pain, severe pain or breakthrough pain.        Follow-up Information    [facility], [facility]. Go on 02/27/2020.   Specialty: General Surgery Why: Follow up at 9:45 AM. Please arrive 30 min prior to appointment time. Bring photo ID and insurance information.  Contact information: [address] [place] [address] [phone]           Signed: Wells Guiles, [facility] 02/07/2020, 8:31 AM Please see Amion for pager number during day hours 7:00am-4:30pm

## 2020-04-24 ENCOUNTER — Encounter: Payer: Self-pay | Admitting: Obstetrics and Gynecology

## 2020-06-26 ENCOUNTER — Encounter: Payer: 59 | Admitting: Obstetrics and Gynecology

## 2020-09-04 IMAGING — US US ABDOMEN LIMITED
1 series · 14 of 25 positions shown · non-contrast
Comparison: None.

CLINICAL DATA: Upper abdominal pain

EXAM:
ULTRASOUND ABDOMEN LIMITED RIGHT UPPER QUADRANT

[Series 1: us abdomen limited · 14 of 87 slices shown]
[im 1/87]
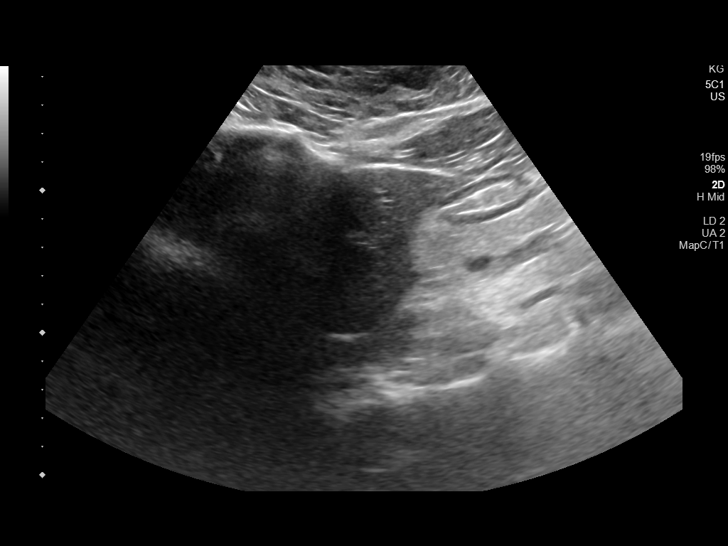
[im 8/87]
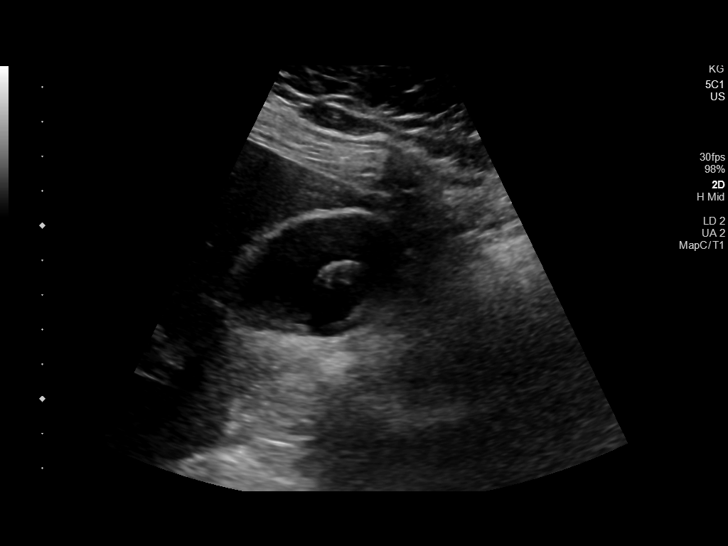
[im 15/87]
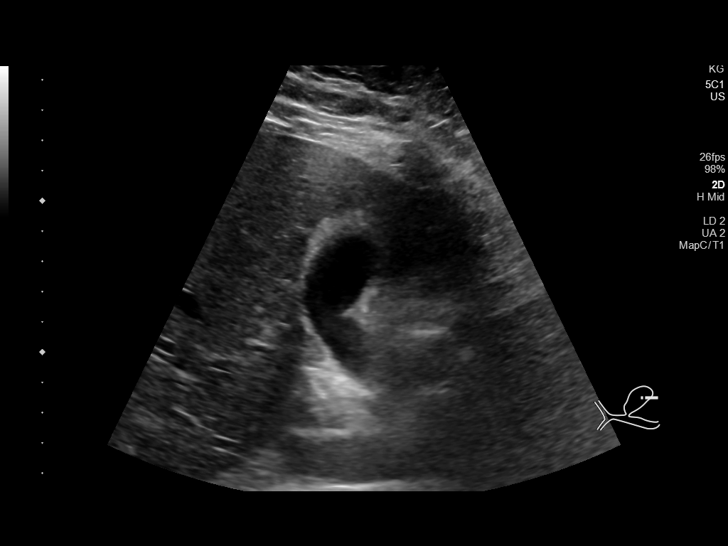
[im 22/87]
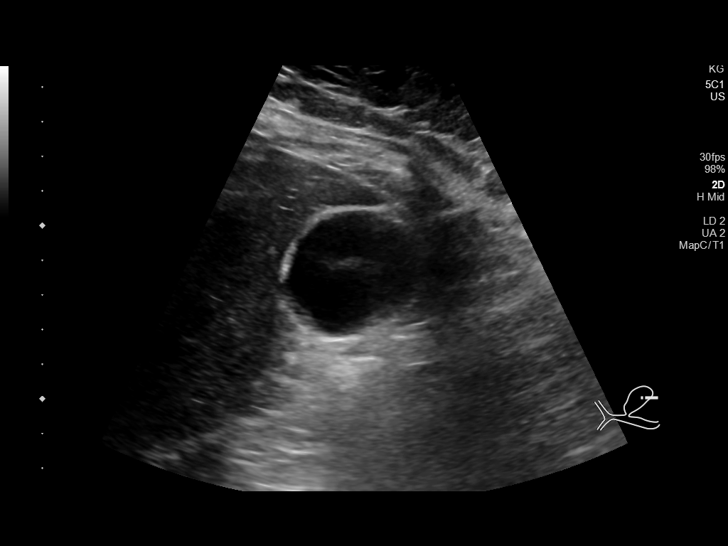
[im 29/87]
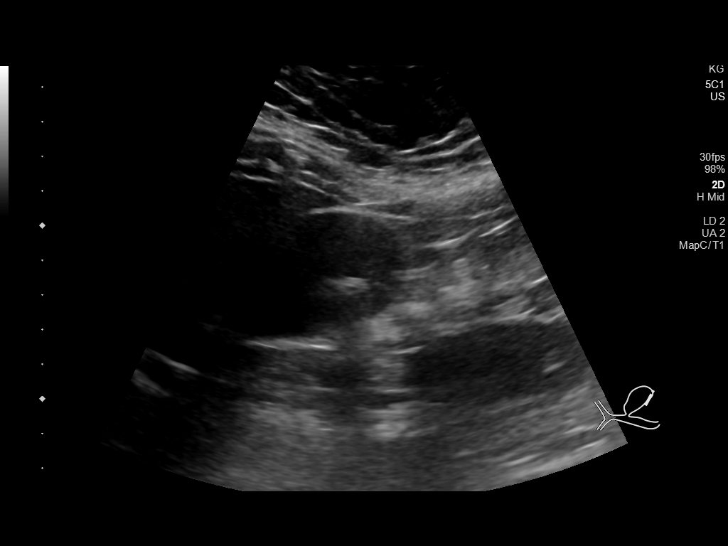
[im 33/87]
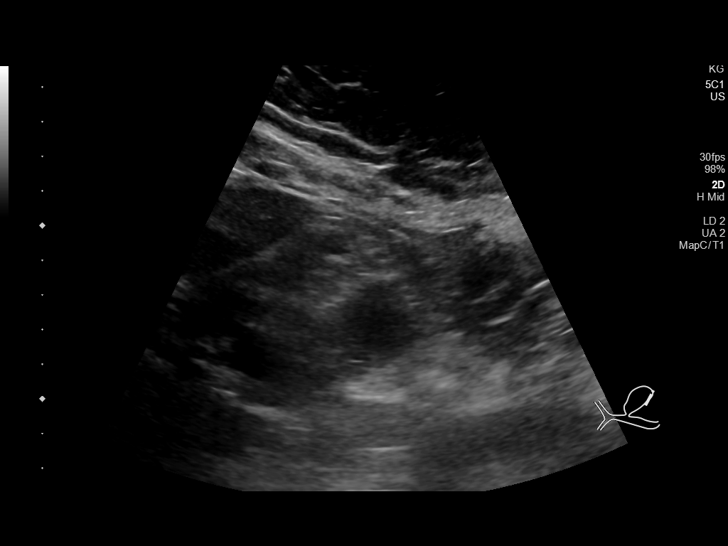
[im 40/87]
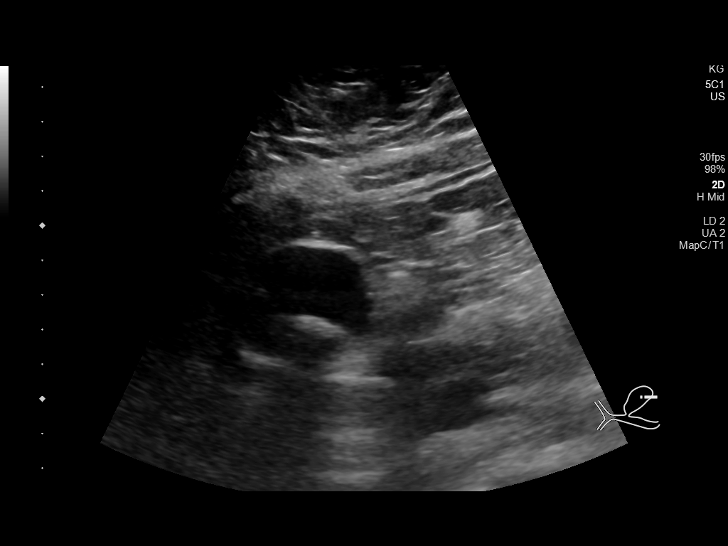
[im 47/87]
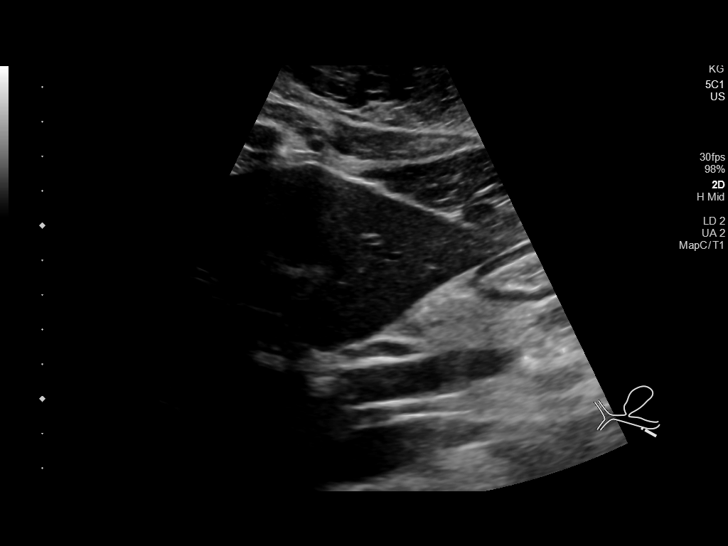
[im 54/87]
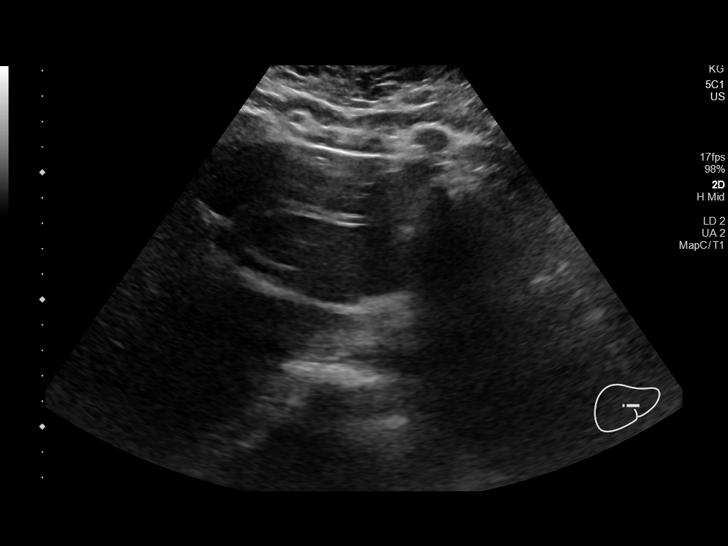
[im 58/87]
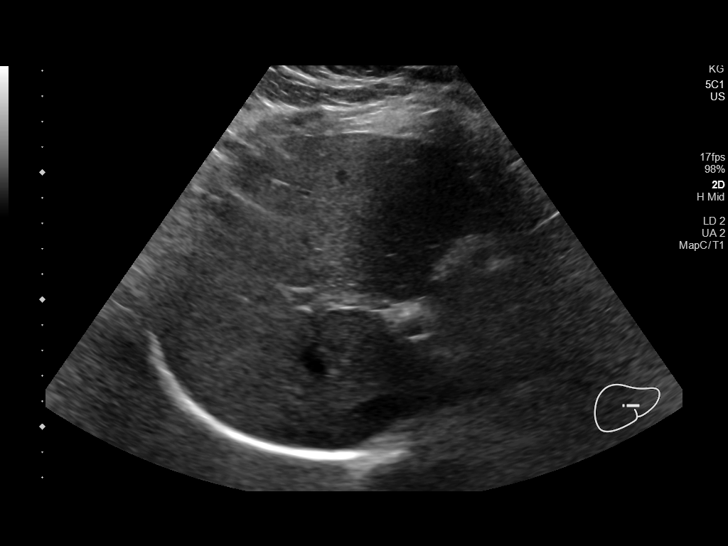
[im 65/87]
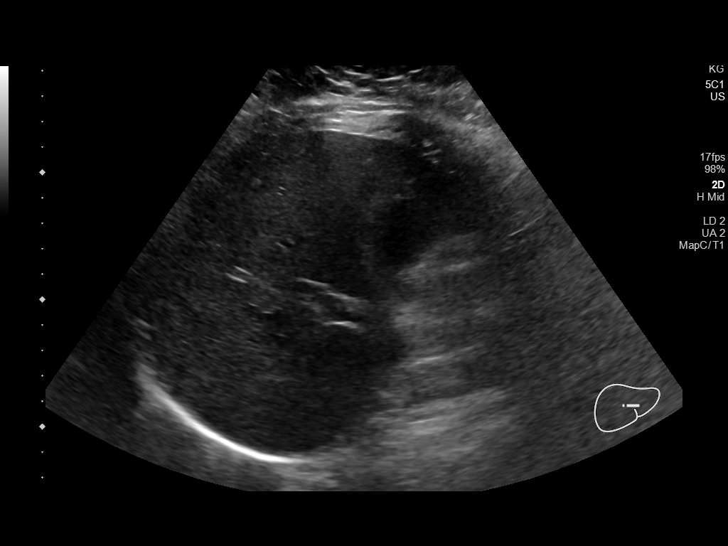
[im 72/87]
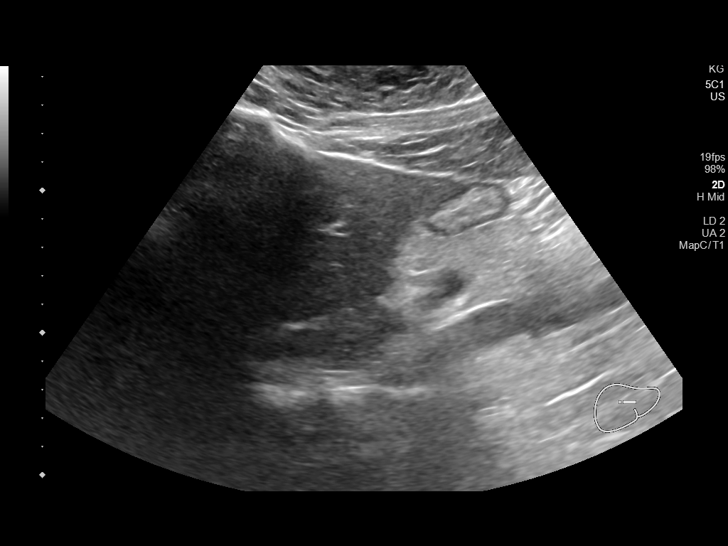
[im 79/87]
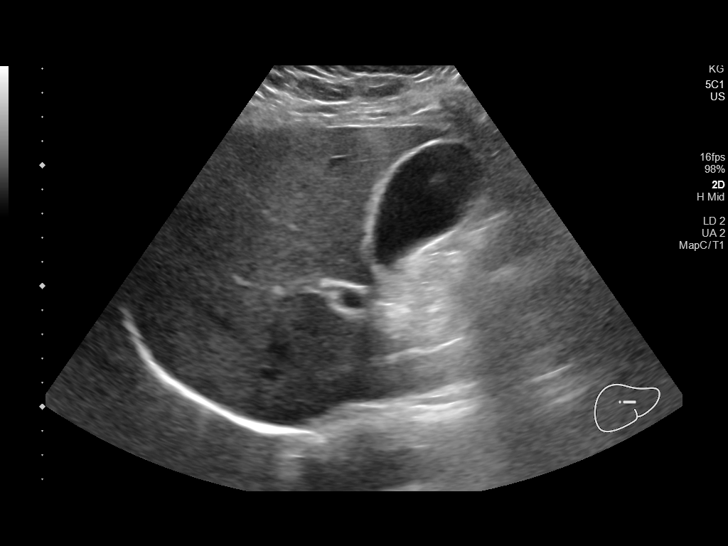
[im 87/87]
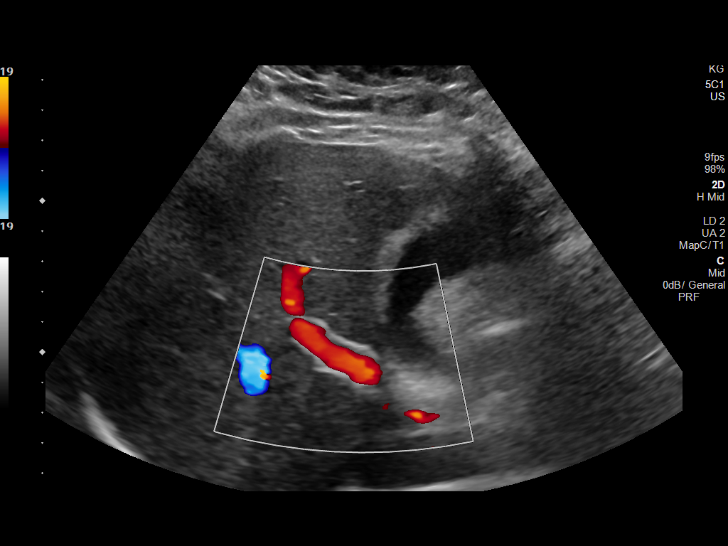

[14 of 25 positions shown; findings below may reference images not displayed]

FINDINGS: Gallbladder:

Within the gallbladder, there is an echogenic focus which moves and
shadows consistent with cholelithiasis. This echogenic focus
measures 1.0 cm in length. Gallbladder wall thickness is upper
normal. No pericholecystic fluid. No sonographic Murphy sign noted
by sonographer.

Common bile duct:

Diameter: 4 mm. No intrahepatic or extrahepatic biliary duct
dilatation.

Liver:

No focal lesion identified. Within normal limits in parenchymal
echogenicity. Portal vein is patent on color Doppler imaging with
normal direction of blood flow towards the liver.

Other: None.
IMPRESSION: Cholelithiasis with gallbladder wall thickening upper normal. This
finding may warrant correlation with nuclear medicine hepatobiliary
gene study to assess for cystic duct patency.

Study otherwise unremarkable.

## 2020-09-04 IMAGING — CT CT ABD-PELV W/ CM
2 of 6 series · 16 of 46 positions shown, 18 images · IV contrast (APPLIED)
Comparison: 02/05/2020 abdominal sonogram.

CLINICAL DATA: Right upper quadrant abdominal pain intermittently
for 1 year, worsening today. Cholelithiasis on ultrasound earlier
today.

EXAM:
CT ABDOMEN AND PELVIS WITH CONTRAST
TECHNIQUE: Multidetector CT imaging of the abdomen and pelvis was performed
using the standard protocol following bolus administration of
intravenous contrast.
CONTRAST:  100mL OMNIPAQUE IOHEXOL 300 MG/ML  SOLN

[Series 2: axial st · axial · 0.83mm/px · z∈[-426,-26]mm · 13 of 94 slices shown, 15 images]
[im 7/94  soft-tissue]
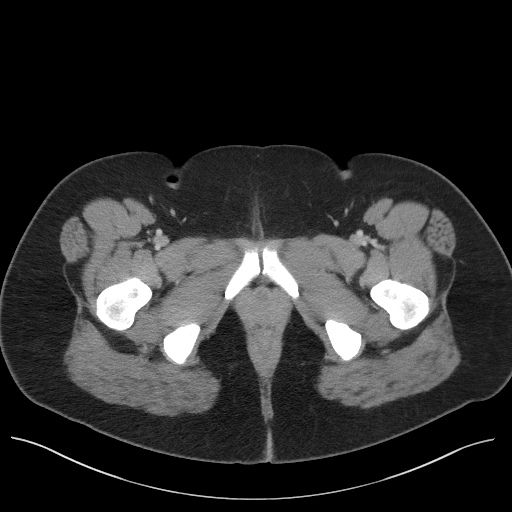
[im 7/94  bone]
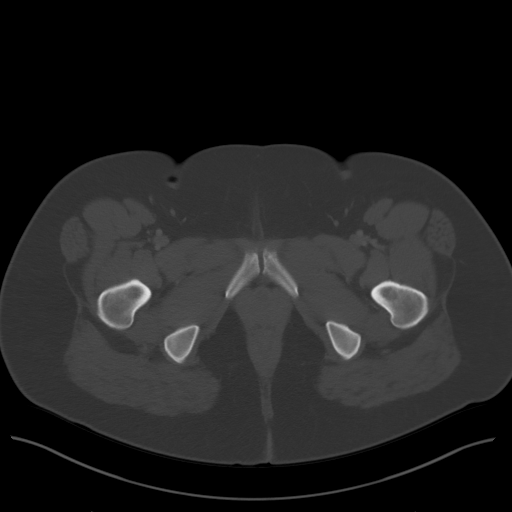
[im 13/94  soft-tissue]
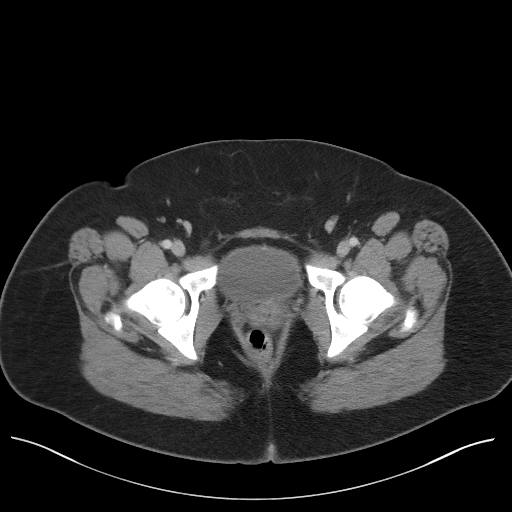
[im 19/94  soft-tissue]
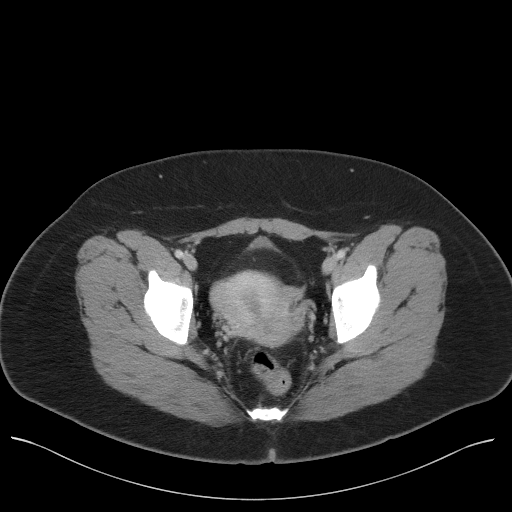
[im 25/94  soft-tissue]
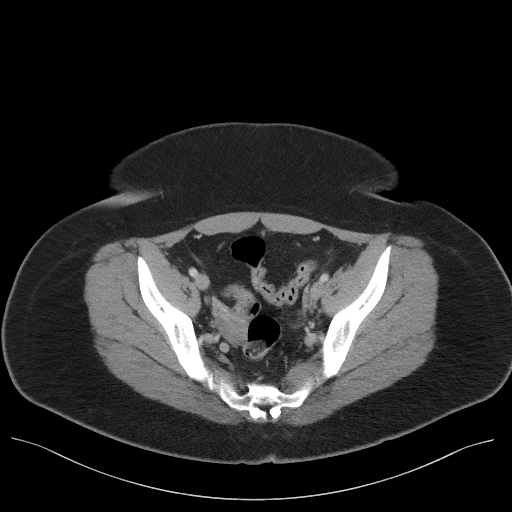
[im 32/94  soft-tissue]
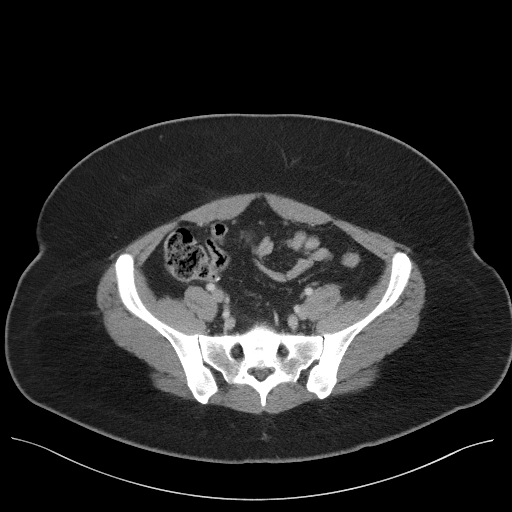
[im 38/94  soft-tissue]
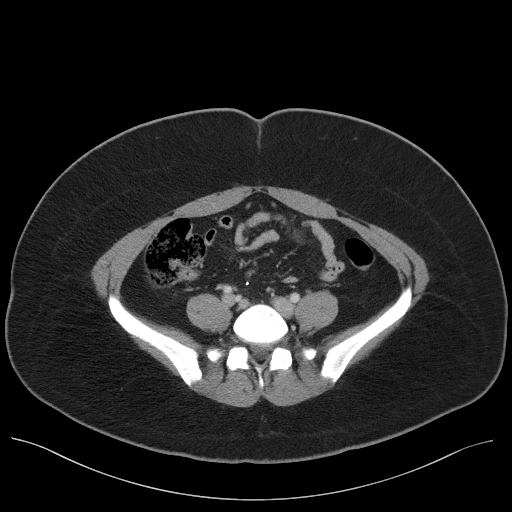
[im 50/94  soft-tissue]
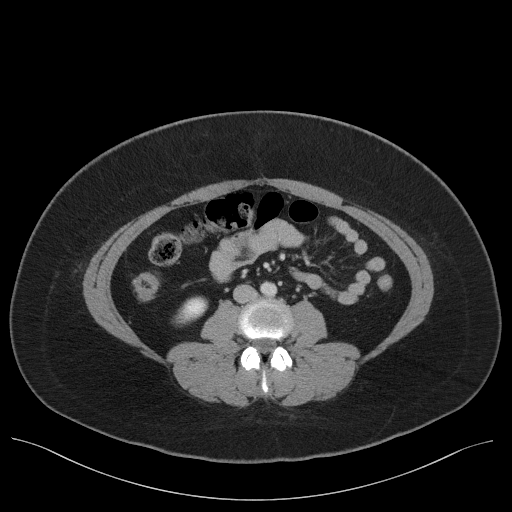
[im 56/94  soft-tissue]
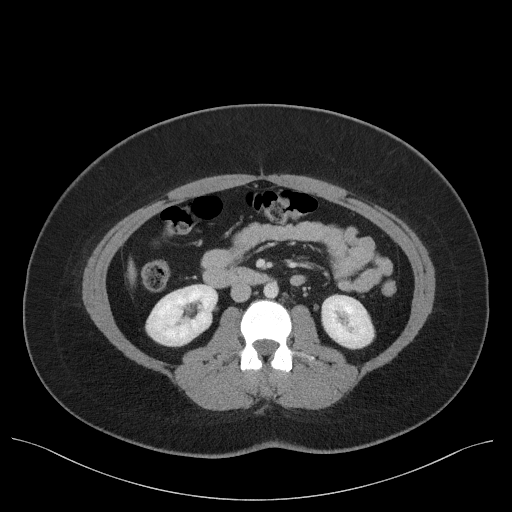
[im 63/94  soft-tissue]
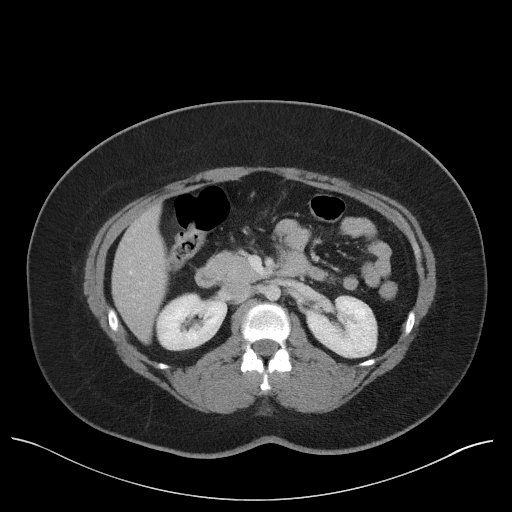
[im 63/94  bone]
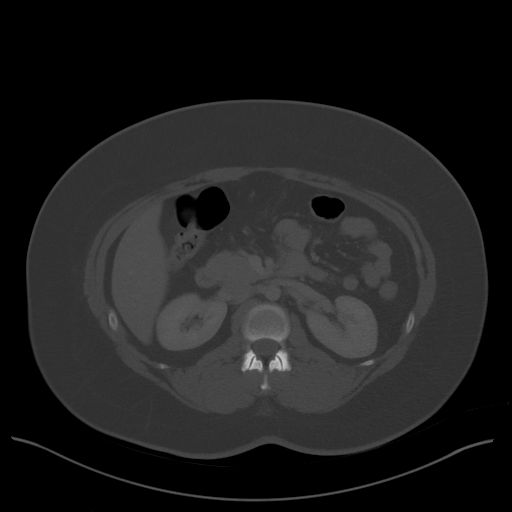
[im 69/94  soft-tissue]
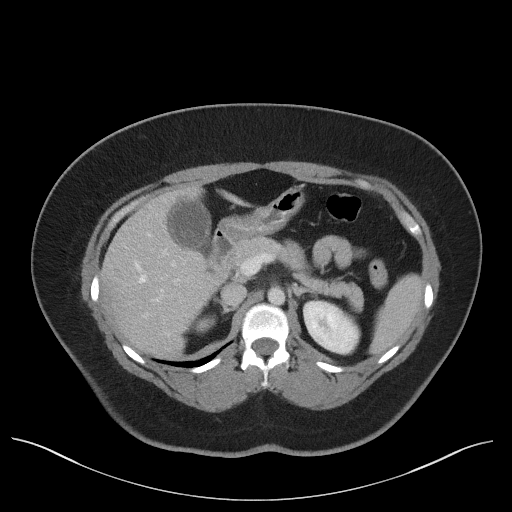
[im 75/94  soft-tissue]
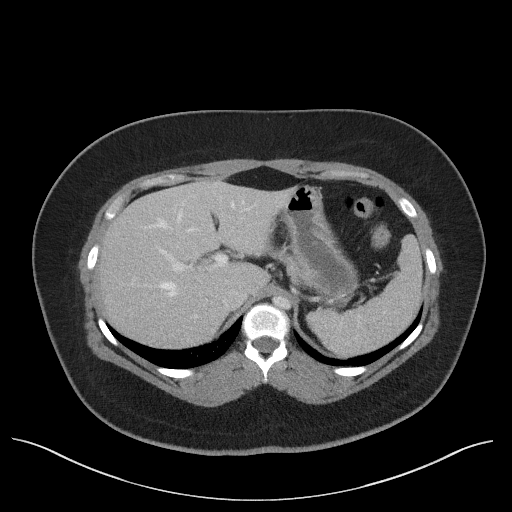
[im 81/94  soft-tissue]
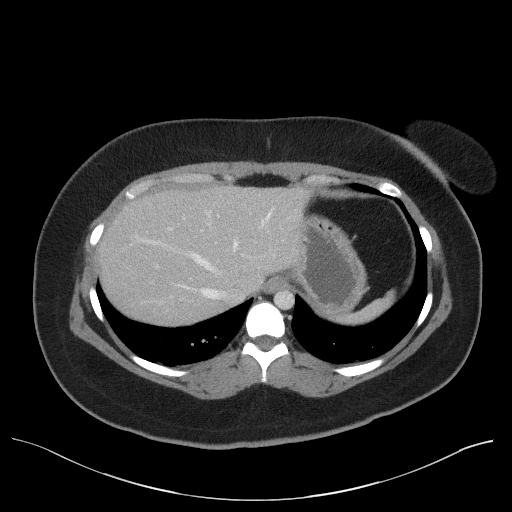
[im 87/94  soft-tissue]
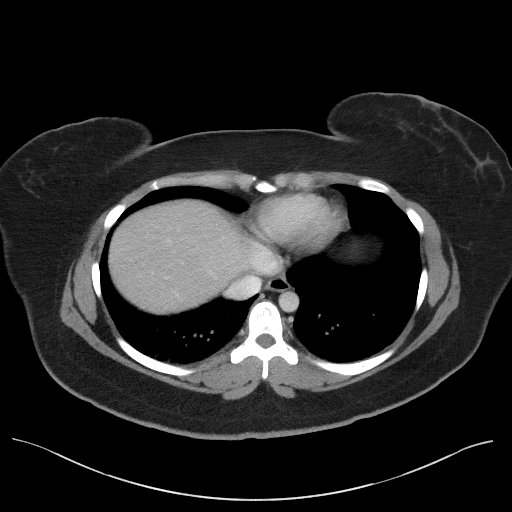

[Series 8: coronal st · coronal · 0.77mm/px · 3 of 97 slices shown]
[im 33/97  soft-tissue]
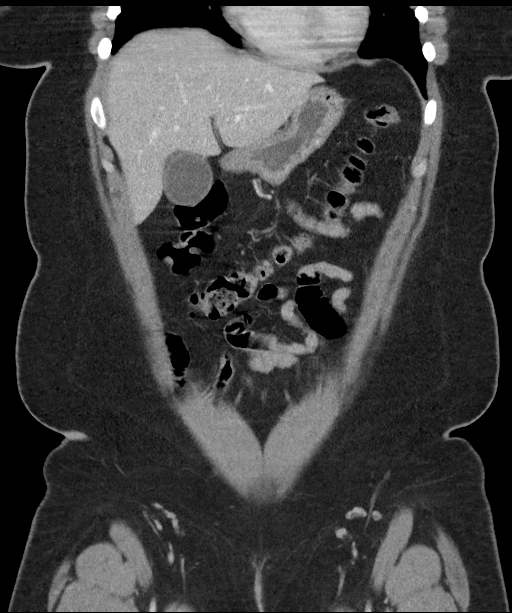
[im 43/97  soft-tissue]
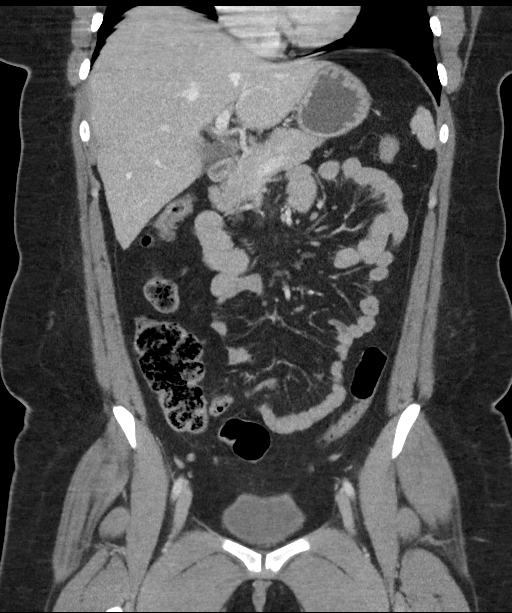
[im 54/97  soft-tissue]
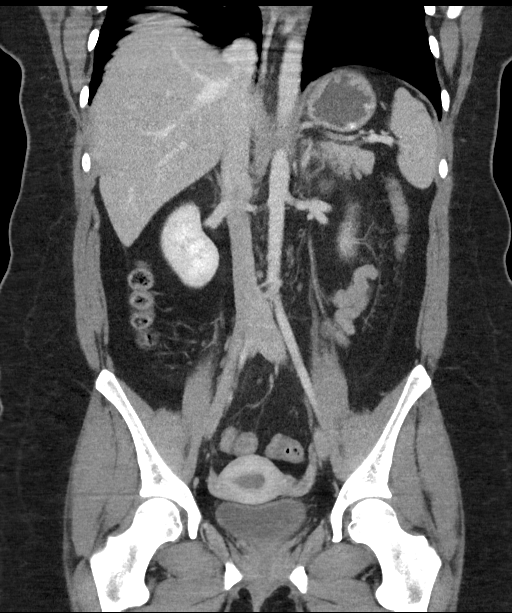

[16 of 46 positions shown; findings below may reference images not displayed]

FINDINGS: Lower chest: No significant pulmonary nodules or acute consolidative
airspace disease.

Hepatobiliary: Normal liver size. No liver mass. Faintly calcified
gallstones in the gallbladder. Mild diffuse gallbladder wall
thickening. Borderline mild gallbladder distention. No
pericholecystic fluid. No biliary ductal dilatation. CBD diameter 3
mm.

Pancreas: Normal, with no mass or duct dilation.

Spleen: Normal size. No mass.

Adrenals/Urinary Tract: Normal adrenals. Normal kidneys with no
hydronephrosis and no renal mass. Normal bladder.

Stomach/Bowel: Normal non-distended stomach. Normal caliber small
bowel with no small bowel wall thickening. Apparent appendectomy.
Normal large bowel with no diverticulosis, large bowel wall
thickening or pericolonic fat stranding.

Vascular/Lymphatic: Normal caliber abdominal aorta. Patent portal,
splenic, hepatic and renal veins. No pathologically enlarged lymph
nodes in the abdomen or pelvis.

Reproductive: Grossly normal uterus.  No adnexal mass.

Other: No pneumoperitoneum, ascites or focal fluid collection.

Musculoskeletal: No aggressive appearing focal osseous lesions.
IMPRESSION: 1. Cholelithiasis. Mild diffuse gallbladder wall thickening.
Borderline mild gallbladder distention. No pericholecystic fluid.
Acute calculus cholecystitis not excluded. Hepatobiliary
scintigraphy study may be considered for further evaluation as
clinically warranted.
2. No biliary ductal dilation.
